# Patient Record
Sex: Female | Born: 1985 | Race: White | Hispanic: No | Marital: Single | Smoking: Never smoker
Health system: Southern US, Community
[De-identification: ages and names within clinical notes are randomized; demographics above are authoritative.]

## PROBLEM LIST (undated history)

## (undated) DIAGNOSIS — T7840XA Allergy, unspecified, initial encounter: Secondary | ICD-10-CM

## (undated) DIAGNOSIS — G809 Cerebral palsy, unspecified: Secondary | ICD-10-CM

## (undated) DIAGNOSIS — G43909 Migraine, unspecified, not intractable, without status migrainosus: Secondary | ICD-10-CM

## (undated) DIAGNOSIS — F419 Anxiety disorder, unspecified: Secondary | ICD-10-CM

## (undated) DIAGNOSIS — F32A Depression, unspecified: Secondary | ICD-10-CM

## (undated) HISTORY — DX: Allergy, unspecified, initial encounter: T78.40XA

## (undated) HISTORY — DX: Cerebral palsy, unspecified: G80.9

## (undated) HISTORY — DX: Migraine, unspecified, not intractable, without status migrainosus: G43.909

## (undated) HISTORY — PX: FOOT CAPSULE RELEASE W/ PERCUTANEOUS HEEL CORD LENGTHENING, TIBIAL TENDON TRANSFER: SHX1658

## (undated) HISTORY — DX: Anxiety disorder, unspecified: F41.9

---
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
## 2007-04-26 ENCOUNTER — Emergency Department (HOSPITAL_COMMUNITY): Admission: EM | Admit: 2007-04-26 | Discharge: 2007-04-26 | Payer: Self-pay | Admitting: Emergency Medicine

## 2007-05-08 ENCOUNTER — Ambulatory Visit: Payer: Self-pay | Admitting: Family Medicine

## 2007-05-11 DIAGNOSIS — G809 Cerebral palsy, unspecified: Secondary | ICD-10-CM | POA: Insufficient documentation

## 2007-05-11 DIAGNOSIS — J45909 Unspecified asthma, uncomplicated: Secondary | ICD-10-CM | POA: Insufficient documentation

## 2007-05-11 DIAGNOSIS — J309 Allergic rhinitis, unspecified: Secondary | ICD-10-CM | POA: Insufficient documentation

## 2007-08-05 ENCOUNTER — Telehealth (INDEPENDENT_AMBULATORY_CARE_PROVIDER_SITE_OTHER): Payer: Self-pay | Admitting: *Deleted

## 2007-12-29 IMAGING — CT CT PELVIS W/O CM
2 of 4 series · 17 of 46 positions shown, 19 images · IV contrast (agent unspecified)
Comparison: None.

CLINICAL DATA: Abdominal pain with nausea and fever.  History of CONTRAST ALLERGY.  
 ABDOMEN CT WITHOUT CONTRAST:
TECHNIQUE: Multidetector CT imaging of the abdomen was performed following the standard protocol without IV contrast.  Oral contrast was administered.
TECHNIQUE: Multidetector CT imaging of the pelvis was performed following the standard protocol without IV contrast.  Oral contrast was administered.

[Series 2: abd pelvis · axial · 0.70mm/px · z∈[-454,-4]mm · 14 of 98 slices shown, 16 images]
[im 4/98  soft-tissue]
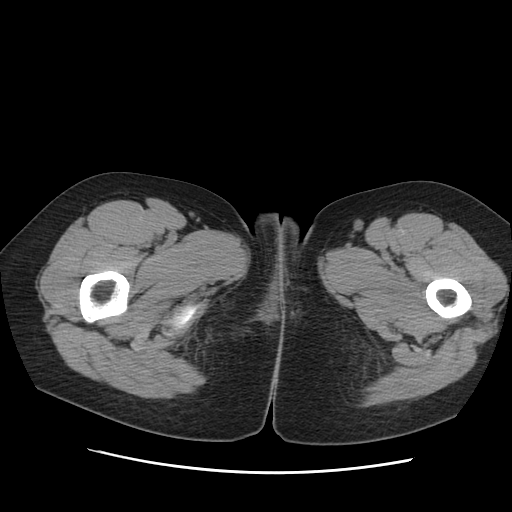
[im 4/98  bone]
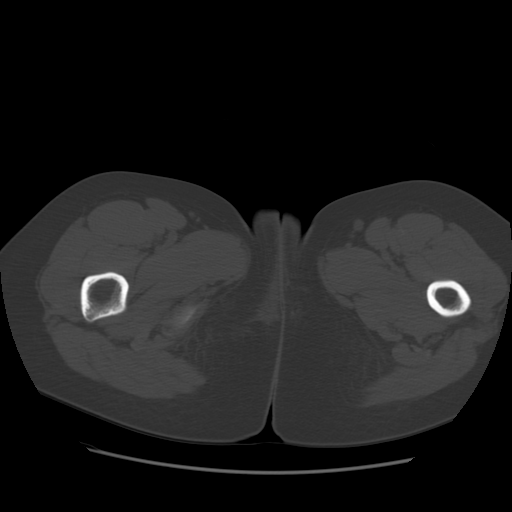
[im 12/98  soft-tissue]
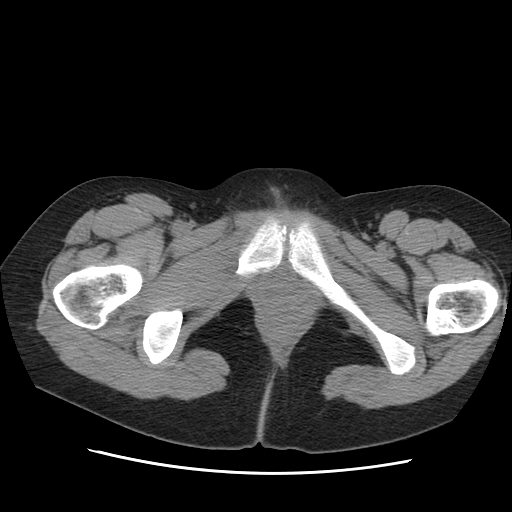
[im 19/98  soft-tissue]
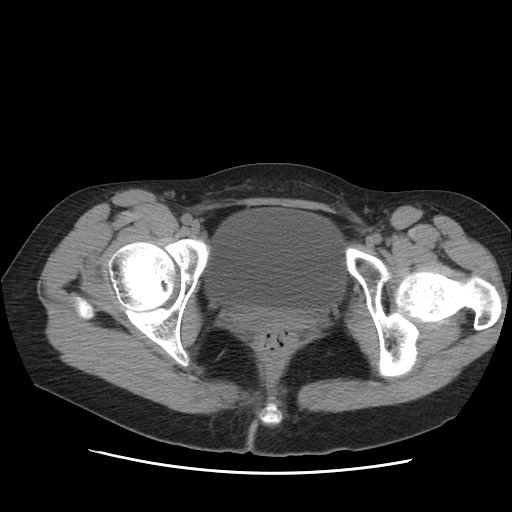
[im 27/98  soft-tissue]
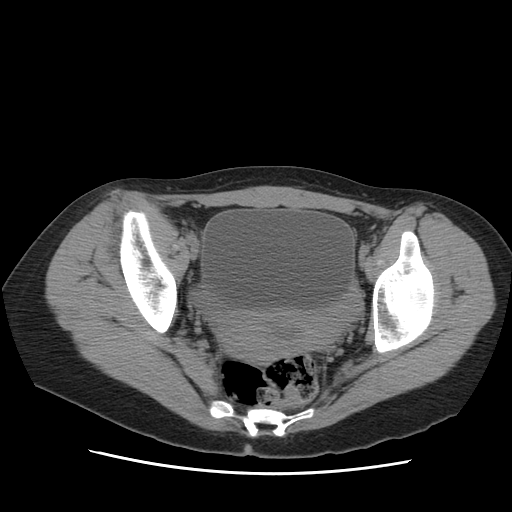
[im 34/98  soft-tissue]
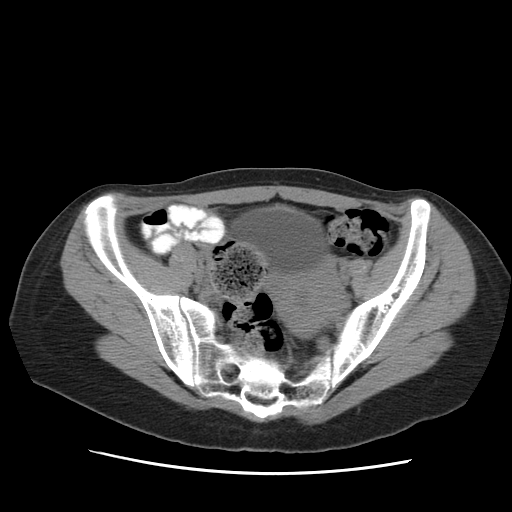
[im 38/98  soft-tissue]
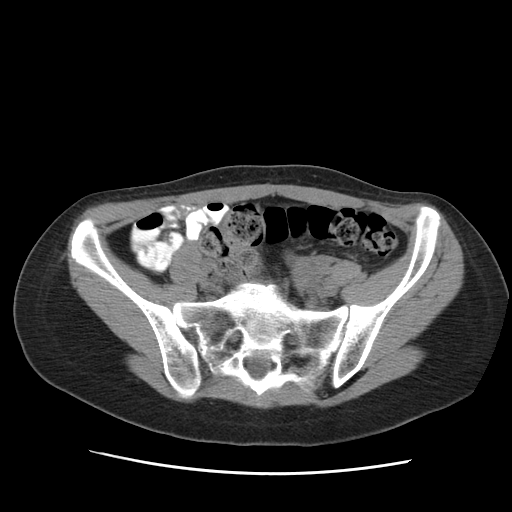
[im 45/98  soft-tissue]
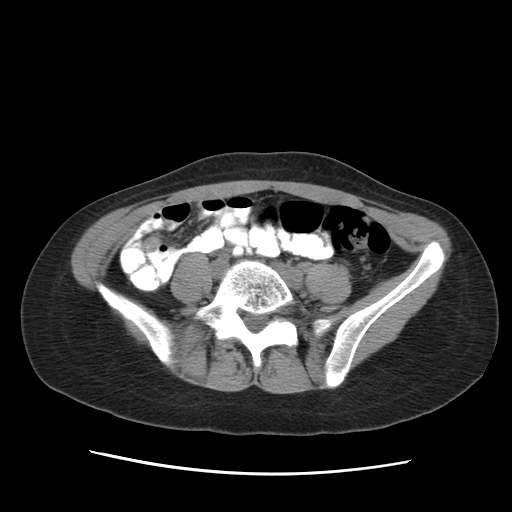
[im 53/98  soft-tissue]
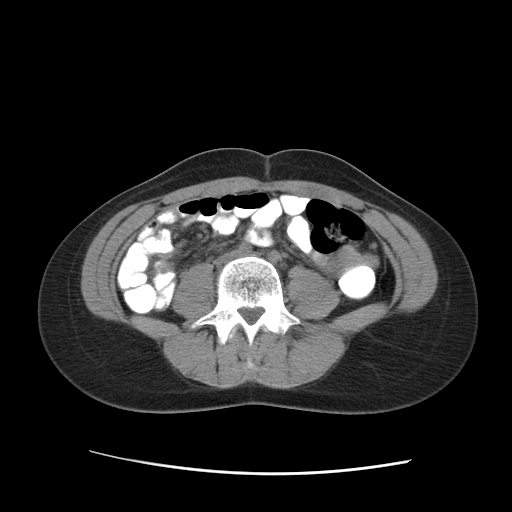
[im 60/98  soft-tissue]
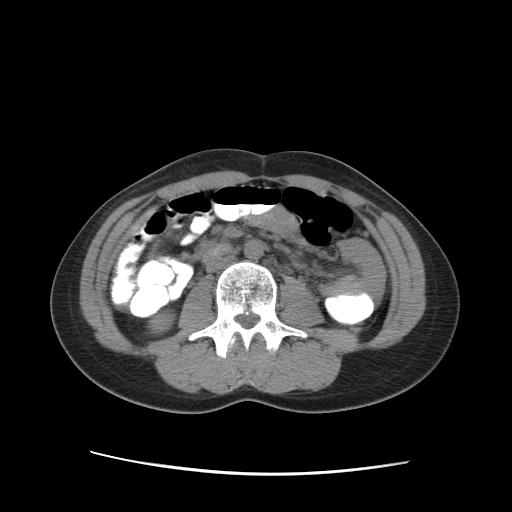
[im 60/98  bone]
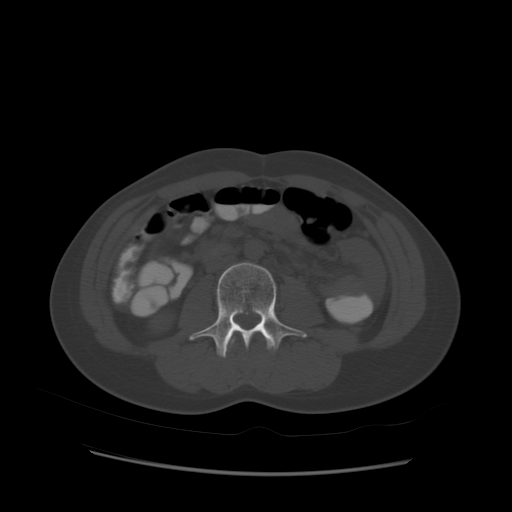
[im 64/98  soft-tissue]
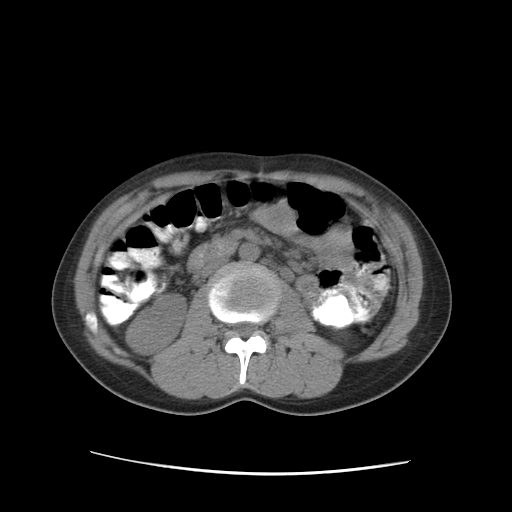
[im 71/98  soft-tissue]
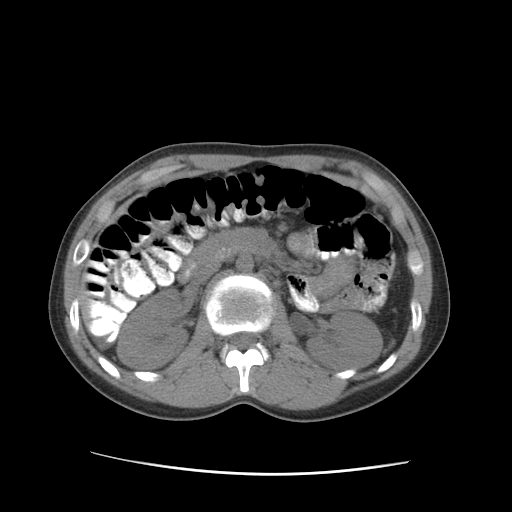
[im 79/98  soft-tissue]
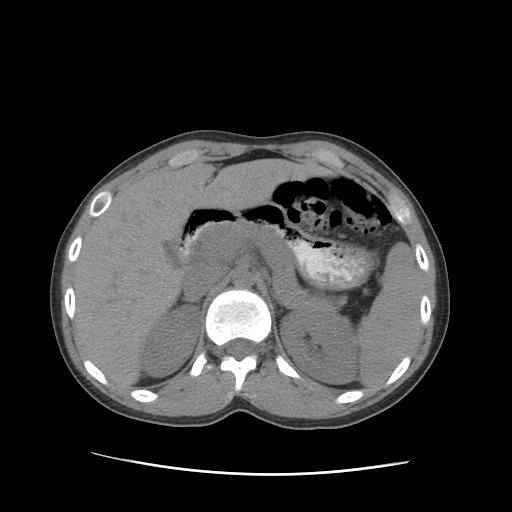
[im 86/98  soft-tissue]
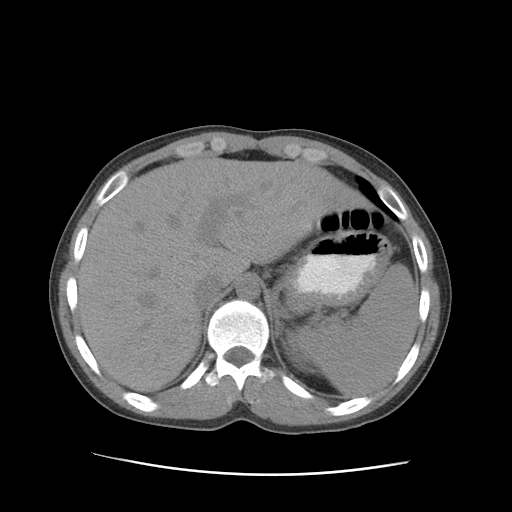
[im 94/98  soft-tissue]
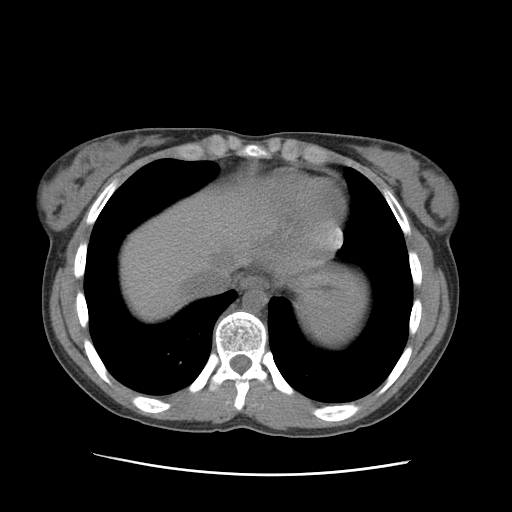

[Series 401: coronal · coronal · 0.90mm/px · 3 of 97 slices shown]
[im 33/97  soft-tissue]
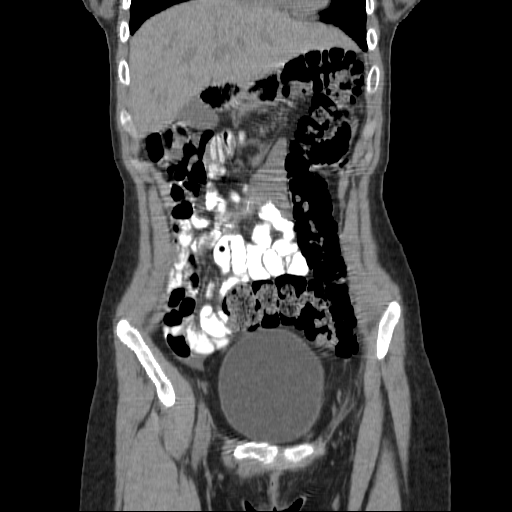
[im 43/97  soft-tissue]
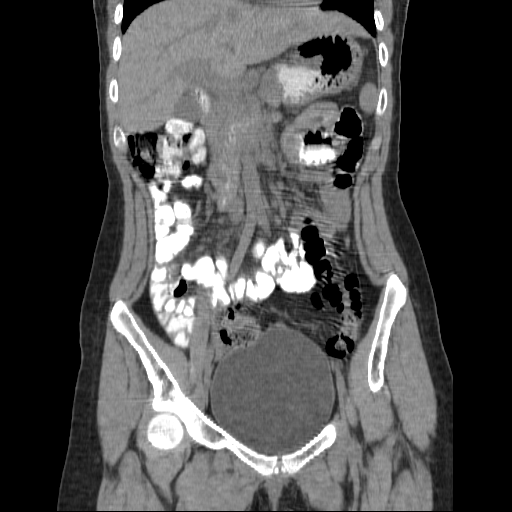
[im 54/97  soft-tissue]
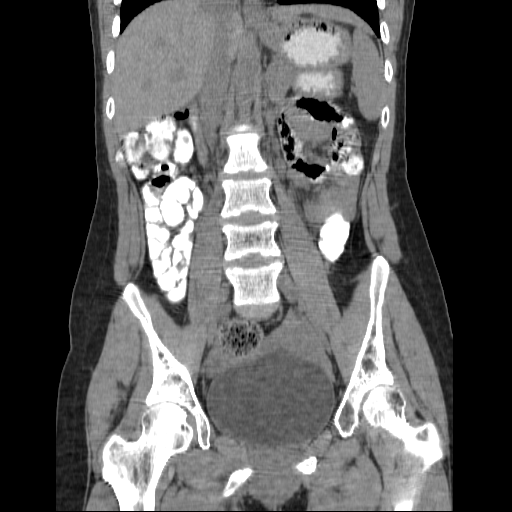

[17 of 46 positions shown; findings below may reference images not displayed]

FINDINGS: Lung bases are clear.  Liver, spleen, pancreas and adrenals appear normal in the unenhanced state.  There are no renal calculi.  No hydronephrosis or hydroureter.  There is a small simple cyst of the mid right kidney.  No adenopathy or ascites. 
 Note is made of a high positioned cecum, ileocecal valve and normal air filled appendix.
IMPRESSION: No acute or significant findings.  Note is made of a high positioned cecum with normal appendix.  
 PELVIS CT WITHOUT CONTRAST:
FINDINGS: No focal masses, adenopathy or fluid collections.  The uterus is eccentrically positioned to the left of midline.  There is a likely right ovarian cyst.  
 No free pelvic fluid.
IMPRESSION: No acute or significant findings.

## 2008-02-24 ENCOUNTER — Telehealth (INDEPENDENT_AMBULATORY_CARE_PROVIDER_SITE_OTHER): Payer: Self-pay | Admitting: *Deleted

## 2008-03-01 ENCOUNTER — Ambulatory Visit: Payer: Self-pay | Admitting: Family Medicine

## 2008-04-06 ENCOUNTER — Telehealth: Payer: Self-pay | Admitting: Family Medicine

## 2008-10-14 ENCOUNTER — Ambulatory Visit: Payer: Self-pay | Admitting: Family Medicine

## 2008-10-14 DIAGNOSIS — N3000 Acute cystitis without hematuria: Secondary | ICD-10-CM | POA: Insufficient documentation

## 2008-10-14 LAB — CONVERTED CEMR LAB
Ketones, urine, test strip: NEGATIVE
Nitrite: NEGATIVE
Specific Gravity, Urine: >=1.03
Urobilinogen, UA: 0.2
pH: 5

## 2008-11-17 ENCOUNTER — Ambulatory Visit: Payer: Self-pay | Admitting: Family Medicine

## 2008-11-17 DIAGNOSIS — J209 Acute bronchitis, unspecified: Secondary | ICD-10-CM

## 2008-11-25 ENCOUNTER — Encounter: Payer: Self-pay | Admitting: Family Medicine

## 2008-11-25 ENCOUNTER — Ambulatory Visit: Payer: Self-pay | Admitting: Family Medicine

## 2008-11-25 ENCOUNTER — Other Ambulatory Visit: Admission: RE | Admit: 2008-11-25 | Discharge: 2008-11-25 | Payer: Self-pay | Admitting: Family Medicine

## 2008-11-28 LAB — CONVERTED CEMR LAB
Basophils Relative: 0.5 % (ref 0.0–3.0)
Chloride: 110 meq/L (ref 96–112)
Cholesterol: 144 mg/dL (ref 0–200)
Eosinophils Absolute: 0.2 10*3/uL (ref 0.0–0.7)
Eosinophils Relative: 4 % (ref 0.0–5.0)
GFR calc Af Amer: 164 mL/min
LDL Cholesterol: 83 mg/dL (ref 0–99)
MCHC: 34.6 g/dL (ref 30.0–36.0)
Monocytes Relative: 7.9 % (ref 3.0–12.0)
Neutrophils Relative %: 59 % (ref 43.0–77.0)
Platelets: 217 10*3/uL (ref 150–400)
RBC: 3.88 M/uL (ref 3.87–5.11)
TSH: 1.63 microintl units/mL (ref 0.35–5.50)
Total Bilirubin: 0.6 mg/dL (ref 0.3–1.2)
Triglycerides: 28 mg/dL (ref 0–149)
WBC: 4.6 10*3/uL (ref 4.5–10.5)

## 2009-07-19 ENCOUNTER — Telehealth: Payer: Self-pay | Admitting: Family Medicine

## 2009-07-21 ENCOUNTER — Ambulatory Visit: Payer: Self-pay | Admitting: Family Medicine

## 2009-07-21 DIAGNOSIS — F4321 Adjustment disorder with depressed mood: Secondary | ICD-10-CM | POA: Insufficient documentation

## 2009-08-21 ENCOUNTER — Ambulatory Visit: Payer: Self-pay | Admitting: Family Medicine

## 2009-08-21 DIAGNOSIS — J019 Acute sinusitis, unspecified: Secondary | ICD-10-CM | POA: Insufficient documentation

## 2009-11-09 ENCOUNTER — Ambulatory Visit: Payer: Self-pay | Admitting: Family Medicine

## 2009-11-09 DIAGNOSIS — R42 Dizziness and giddiness: Secondary | ICD-10-CM | POA: Insufficient documentation

## 2009-11-09 LAB — CONVERTED CEMR LAB
Blood in Urine, dipstick: NEGATIVE
Glucose, Urine, Semiquant: NEGATIVE
Protein, U semiquant: NEGATIVE
Specific Gravity, Urine: >=1.03
Urobilinogen, UA: 0.2
pH: 7

## 2009-11-10 LAB — CONVERTED CEMR LAB
ALT: 21 units/L (ref 0–35)
AST: 24 units/L (ref 0–37)
Albumin: 4.1 g/dL (ref 3.5–5.2)
BUN: 11 mg/dL (ref 6–23)
Basophils Relative: 0.4 % (ref 0.0–3.0)
Calcium: 8.9 mg/dL (ref 8.4–10.5)
Chloride: 100 meq/L (ref 96–112)
Eosinophils Absolute: 0.1 10*3/uL (ref 0.0–0.7)
Glucose, Bld: 75 mg/dL (ref 70–99)
HCT: 37.1 % (ref 36.0–46.0)
Hemoglobin: 12.6 g/dL (ref 12.0–15.0)
Lymphocytes Relative: 21.6 % (ref 12.0–46.0)
MCHC: 33.9 g/dL (ref 30.0–36.0)
Monocytes Relative: 6.3 % (ref 3.0–12.0)
Potassium: 4.2 meq/L (ref 3.5–5.1)
Total Bilirubin: 0.8 mg/dL (ref 0.3–1.2)

## 2009-11-20 ENCOUNTER — Ambulatory Visit: Payer: Self-pay | Admitting: Licensed Clinical Social Worker

## 2009-11-28 ENCOUNTER — Telehealth: Payer: Self-pay | Admitting: Family Medicine

## 2009-11-29 ENCOUNTER — Ambulatory Visit: Payer: Self-pay | Admitting: Licensed Clinical Social Worker

## 2009-12-04 ENCOUNTER — Ambulatory Visit: Payer: Self-pay | Admitting: Licensed Clinical Social Worker

## 2009-12-18 ENCOUNTER — Ambulatory Visit: Payer: Self-pay | Admitting: Licensed Clinical Social Worker

## 2009-12-25 ENCOUNTER — Ambulatory Visit: Payer: Self-pay | Admitting: Licensed Clinical Social Worker

## 2010-04-03 ENCOUNTER — Ambulatory Visit: Payer: Self-pay | Admitting: Internal Medicine

## 2010-04-03 DIAGNOSIS — G44229 Chronic tension-type headache, not intractable: Secondary | ICD-10-CM

## 2010-05-29 ENCOUNTER — Ambulatory Visit: Payer: Self-pay | Admitting: Family Medicine

## 2010-05-29 DIAGNOSIS — M26629 Arthralgia of temporomandibular joint, unspecified side: Secondary | ICD-10-CM | POA: Insufficient documentation

## 2010-05-29 DIAGNOSIS — F329 Major depressive disorder, single episode, unspecified: Secondary | ICD-10-CM

## 2010-06-04 ENCOUNTER — Telehealth: Payer: Self-pay | Admitting: Family Medicine

## 2010-11-21 ENCOUNTER — Encounter: Payer: Self-pay | Admitting: Family Medicine

## 2010-11-27 NOTE — Progress Notes (Signed)
Summary: doesn't like Celexa  Phone Note Call from Patient   Caller: Patient Call For: Nelwyn Salisbury MD Summary of Call: Celexa is giving her a headache and nausea on the first 2 days. [phone] Initial call taken by: Lynann Beaver CMA,  June 04, 2010 4:47 PM  Follow-up for Phone Call        try cutting the pill in half  Follow-up by: Nelwyn Salisbury MD,  June 04, 2010 5:00 PM  Additional Follow-up for Phone Call Additional follow up Details #1::        [facility] Additional Follow-up by: Lynann Beaver CMA,  June 05, 2010 8:04 AM    Additional Follow-up for Phone Call Additional follow up Details #2::    Pt notified. Follow-up by: Lynann Beaver CMA,  June 05, 2010 11:03 AM

## 2010-11-27 NOTE — Progress Notes (Signed)
Summary: Pt needs to get immunization record  Phone Note Call from Patient Call back at Home Phone [phone]   Caller: Patient Summary of Call: Pt is req immunization record. 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 Pt needs to pick this up tomorrow a.m.  Initial call taken by: Lucy Antigua,  November 28, 2009 9:47 AM  Follow-up for Phone Call        form up front ready for p/u, pt aware Follow-up by: Alfred Levins, CMA,  November 29, 2009 8:41 AM

## 2010-11-27 NOTE — Assessment & Plan Note (Signed)
Summary: syncope/njr   Vital Signs:  Patient profile:   26 year old female Menstrual status:  regular Weight:      150 pounds Temp:     98.0 degrees F oral Pulse rate:   66 / minute BP sitting:   122 / 82  (left arm) Cuff size:   regular  Vitals Entered By: Alfred Levins, CMA (November 09, 2009 11:11 AM) CC: 2 syncopal episodes this morning, fatigued and h/a   History of Present Illness: Here for 2 spells of lightheadedness this am which occurred at work. She felt fine last night and got a good night's sleep. She felt fine when she got up and had her usual breakfast of cereal. At work she felt slightly shaky and had a mild HA, then she suddenly felt warm and weak, and thought she might pass out. She sat down and this feeling went away. Then a few minutes later it happened again. She sat down again, and again it went away. This has not happened any more after that. She never completely lost consciousness. Now she feels fine except for a mild HA. No SOB or chest pains or nausea or palpitations. No vision changes and no neurologic deficits.  Her only med is her BCP which she has been in regularly for the past 4 months. Her LMP was 6 weeks ago, but she is usually irregular. I asked about stress possible contributing to these episodes, and she admits to having a tough time over the holidays with issues including missing her mother. She is interested in seeing a therapist, which I have suggested she do numerous times.  Current Medications (verified): 1)  Gianvi 3-0.02 Mg Tabs (Drospirenone-Ethinyl Estradiol) .Marland Kitchen.. 1 By Mouth Once Daily  Allergies (verified): 1)  ! * Dye  Past History:  Past Medical History: Reviewed history from 03/01/2008 and no changes required. Allergic rhinitis Asthma Cerebral Palsy  Past Surgical History: Reviewed history from 11/25/2008 and no changes required. Lengthen a tight heel cord left leg  Review of Systems  The patient denies anorexia, fever, weight  loss, weight gain, vision loss, decreased hearing, hoarseness, chest pain, syncope, dyspnea on exertion, peripheral edema, prolonged cough, headaches, hemoptysis, abdominal pain, melena, hematochezia, severe indigestion/heartburn, hematuria, incontinence, genital sores, muscle weakness, suspicious skin lesions, transient blindness, difficulty walking, depression, unusual weight change, abnormal bleeding, enlarged lymph nodes, angioedema, breast masses, and testicular masses.    Physical Exam  General:  Well-developed,well-nourished,in no acute distress; alert,appropriate and cooperative throughout examination Head:  Normocephalic and atraumatic without obvious abnormalities. No apparent alopecia or balding. Eyes:  No corneal or conjunctival inflammation noted. EOMI. Perrla. Funduscopic exam benign, without hemorrhages, exudates or papilledema. Vision grossly normal. Ears:  External ear exam shows no significant lesions or deformities.  Otoscopic examination reveals clear canals, tympanic membranes are intact bilaterally without bulging, retraction, inflammation or discharge. Hearing is grossly normal bilaterally. Nose:  External nasal examination shows no deformity or inflammation. Nasal mucosa are pink and moist without lesions or exudates. Mouth:  Oral mucosa and oropharynx without lesions or exudates.  Teeth in good repair. Neck:  No deformities, masses, or tenderness noted. Lungs:  Normal respiratory effort, chest expands symmetrically. Lungs are clear to auscultation, no crackles or wheezes. Heart:  Normal rate and regular rhythm. S1 and S2 normal without gallop, murmur, click, rub or other extra sounds. Neurologic:  No cranial nerve deficits noted. Station and gait are normal. Plantar reflexes are down-going bilaterally. DTRs are symmetrical throughout. Sensory, motor and coordinative functions  appear intact. Psych:  Cognition and judgment appear intact. Alert and cooperative with normal  attention span and concentration. No apparent delusions, illusions, hallucinations   Impression & Recommendations:  Problem # 1:  DIZZINESS (ICD-780.4)  Orders: Venipuncture (16109) TLB-BMP (Basic Metabolic Panel-BMET) (80048-METABOL) TLB-CBC Platelet - w/Differential (85025-CBCD) TLB-Hepatic/Liver Function Pnl (80076-HEPATIC) TLB-TSH (Thyroid Stimulating Hormone) (84443-TSH) UA Dipstick w/o Micro (manual) (60454) Urine Pregnancy Test  (09811)  Complete Medication List: 1)  Gianvi 3-0.02 Mg Tabs (Drospirenone-ethinyl estradiol) .Marland Kitchen.. 1 by mouth once daily  Patient Instructions: 1)  Get labs today to rule out anemia, glucose abnormalities, etc. but I think stress may be at the heart of the problem. I strongly encouraged her to contact Judithe Modest here to set up some therapy, and she said she would.   Laboratory Results   Urine Tests  Date/Time Received: November 09, 2009 12:12 PM Date/Time Reported: November 09, 2009 12:12 PM  Routine Urinalysis   Color: yellow Appearance: Clear Glucose: negative   (Normal Range: Negative) Bilirubin: small   (Normal Range: Negative) Ketone: negative   (Normal Range: Negative) Spec. Gravity: >=1.030   (Normal Range: 1.003-1.035) Blood: negative   (Normal Range: Negative) pH: 7.0   (Normal Range: 5.0-8.0) Protein: negative   (Normal Range: Negative) Urobilinogen: 0.2   (Normal Range: 0-1) Nitrite: negative   (Normal Range: Negative) Leukocyte Esterace: small   (Normal Range: Negative)    Urine HCG: negative Comments: Alfred Levins, CMA  November 09, 2009 12:12 PM

## 2010-11-27 NOTE — Assessment & Plan Note (Signed)
Summary: ?ear inf/ok per deb/njr----PT Julia Anderson [facility] // RS   Vital Signs:  Patient profile:   25 year old female Menstrual status:  regular Weight:      148 pounds Temp:     98.3 degrees F oral BP sitting:   120 / 80  (left arm) Cuff size:   regular  Vitals Entered By: Kathrynn Speed CMA (May 29, 2010 2:42 PM) CC: Pain in right ear feels like fuild in ear, x 4 day, drainage down her throat, headache, neusea, not want to eat, pain in right ear when she chews food,src    History of Present Illness: Here for 2 problems. First she has had a tough month with dealing with the anniversaries of significant losses in her life. 2 years ago her mother died, and one year ago her step father died. She has been very depressed and anxious, tearful, can't sleep , etc. She denies any suicidal thoughts. I have recommended medications for her seberal time sin the past, and she has always declined. Today however she is willing to discuss this. Her insurance will not cover seeing a therapist, so she cannot afford this. Second, for one week she has had pain in the right ear and the right side of her face. No fever or HA or ST. She chews gum frequently. Chewing makes this pain worse.   Current Medications (verified): 1)  Gianvi 3-0.02 Mg Tabs (Drospirenone-Ethinyl Estradiol) .Marland Kitchen.. 1 By Mouth Once Daily  Allergies (verified): 1)  ! * Dye  Past History:  Past Medical History: Reviewed history from 03/01/2008 and no changes required. Allergic rhinitis Asthma Cerebral Palsy  Review of Systems  The patient denies anorexia, fever, weight loss, weight gain, vision loss, decreased hearing, hoarseness, chest pain, syncope, dyspnea on exertion, peripheral edema, prolonged cough, headaches, hemoptysis, abdominal pain, melena, hematochezia, severe indigestion/heartburn, hematuria, incontinence, genital sores, muscle weakness, suspicious skin lesions, transient blindness, difficulty walking, unusual weight change, abnormal  bleeding, enlarged lymph nodes, angioedema, breast masses, and testicular masses.    Physical Exam  General:  Well-developed,well-nourished,in no acute distress; alert,appropriate and cooperative throughout examination Head:  Normocephalic and atraumatic without obvious abnormalities. No apparent alopecia or balding. Eyes:  No corneal or conjunctival inflammation noted. EOMI. Perrla. Funduscopic exam benign, without hemorrhages, exudates or papilledema. Vision grossly normal. Ears:  External ear exam shows no significant lesions or deformities.  Otoscopic examination reveals clear canals, tympanic membranes are intact bilaterally without bulging, retraction, inflammation or discharge. Hearing is grossly normal bilaterally. Nose:  External nasal examination shows no deformity or inflammation. Nasal mucosa are pink and moist without lesions or exudates. Mouth:  Oral mucosa and oropharynx without lesions or exudates.  Teeth in good repair. Her right TMJ is very tender and has some crepitus.  Neck:  No deformities, masses, or tenderness noted. Psych:  Oriented X3, memory intact for recent and remote, normally interactive, and depressed affect.     Impression & Recommendations:  Problem # 1:  DEPRESSION (ICD-311)  Her updated medication list for this problem includes:    Celexa 20 Mg Tabs (Citalopram hydrobromide) ..... Once daily  Problem # 2:  TEMPOROMANDIBULAR JOINT PAIN (ICD-524.62)  Complete Medication List: 1)  Gianvi 3-0.02 Mg Tabs (Drospirenone-ethinyl estradiol) .Marland Kitchen.. 1 by mouth once daily 2)  Diclofenac Sodium 50 Mg Tbec (Diclofenac sodium) .... Three times a day as needed for pain 3)  Celexa 20 Mg Tabs (Citalopram hydrobromide) .... Once daily  Patient Instructions: 1)  Start on Celexa, and recheck in 2  weeks. Advised her to stop chewing gum. try Diclofenac. She needs to see her dentist as well.  Prescriptions: CELEXA 20 MG TABS (CITALOPRAM HYDROBROMIDE) once daily  #30 x 2    Entered and Authorized by:   Nelwyn Salisbury MD   Signed by:   Nelwyn Salisbury MD on 05/29/2010   Method used:   Electronically to        [facility] [address] [phone]* (retail)       [address] [place], [place]  [address]       Ph: [phone]       Fax: [phone]   RxID:   [id number] DICLOFENAC SODIUM 50 MG TBEC (DICLOFENAC SODIUM) three times a day as needed for pain  #60 x 5   Entered and Authorized by:   Nelwyn Salisbury MD   Signed by:   Nelwyn Salisbury MD on 05/29/2010   Method used:   Electronically to        [facility] [address] [phone]* (retail)       [address] [place], [place]  [address]       Ph: [phone]       Fax: [phone]   RxID:   [id number]

## 2010-11-27 NOTE — Assessment & Plan Note (Signed)
Summary: headaches//ccm   Vital Signs:  Patient profile:   25 year old female Menstrual status:  regular Weight:      143 pounds Temp:     98.4 degrees F oral BP sitting:   108 / 70  (right arm) Cuff size:   regular  Vitals Entered By: Duard Brady LPN (April 04, 5783 11:02 AM) CC: c/o headache/(L) side head pressure - re-occuring problem   , fatigue Is Patient Diabetic? No   CC:  c/o headache/(L) side head pressure - re-occuring problem    and fatigue.  History of Present Illness: 25 year old female, who has a long history of headaches.  She states that at  age 77, she had a negative head CT;  describes fairly mild headaches often begin in the left temporal region.  They seemed to benefit from Advil and Excedrin.  No focal neurological symptoms.  She does have a history of allergic rhinitis, but very little in the way of allergy symptoms.  Although the headaches are fairly mild.  She is somewhat concerned due to the chronicity  Preventive Screening-Counseling & Management  Alcohol-Tobacco     Smoking Status: never  Allergies: 1)  ! * Dye  Past History:  Past Medical History: Reviewed history from 03/01/2008 and no changes required. Allergic rhinitis Asthma Cerebral Palsy  Physical Exam  General:  Well-developed,well-nourished,in no acute distress; alert,appropriate and cooperative throughout examination Head:  Normocephalic and atraumatic without obvious abnormalities. No apparent alopecia or balding. Eyes:  No corneal or conjunctival inflammation noted. EOMI. Perrla. Funduscopic exam benign, without hemorrhages, exudates or papilledema. Vision grossly normal. Mouth:  low-lying uvula.  low-lying uvula.   Neck:  No deformities, masses, or tenderness noted. Lungs:  Normal respiratory effort, chest expands symmetrically. Lungs are clear to auscultation, no crackles or wheezes. Heart:  Normal rate and regular rhythm. S1 and S2 normal without gallop, murmur, click, rub  or other extra sounds. Neurologic:  alert & oriented X3, cranial nerves II-XII intact, sensation intact to light touch, gait normal, and finger-to-nose normal.  alert & oriented X3, cranial nerves II-XII intact, sensation intact to light touch, gait normal, and finger-to-nose normal.     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 1 by mouth once daily 2)  Excedrin Extra Strength [phone] Mg Tabs (Aspirin-acetaminophen-caffeine) .... Prn 3)  Ibuprofen 200 Mg Tabs (Ibuprofen) .... Prn  Patient Instructions: 1)  Please schedule a follow-up appointment as needed. Prescriptions: GIANVI 3-0.02 MG TABS (DROSPIRENONE-ETHINYL ESTRADIOL) 1 by mouth once daily  #3 month x 4   Entered and Authorized by:   Gordy Savers  MD   Signed by:   Gordy Savers  MD on 04/03/2010   Method used:   Electronically to        [facility] [address] [phone]* (retail)       [address] [place], [place]  [address]       Ph: [phone]       Fax: [phone]   RxID:   [id number]

## 2010-12-03 ENCOUNTER — Ambulatory Visit (INDEPENDENT_AMBULATORY_CARE_PROVIDER_SITE_OTHER): Payer: BC Managed Care – PPO | Admitting: Family Medicine

## 2010-12-03 ENCOUNTER — Other Ambulatory Visit (HOSPITAL_COMMUNITY)
Admission: RE | Admit: 2010-12-03 | Discharge: 2010-12-03 | Disposition: A | Discharge status: Home / Self Care | Payer: BC Managed Care – PPO | Source: Ambulatory Visit | Attending: Family Medicine | Admitting: Family Medicine

## 2010-12-03 ENCOUNTER — Other Ambulatory Visit: Payer: Self-pay | Admitting: Family Medicine

## 2010-12-03 ENCOUNTER — Encounter: Payer: Self-pay | Admitting: Family Medicine

## 2010-12-03 VITALS — BP 110/70 | HR 70 | Ht 68.0 in | Wt 158.0 lb

## 2010-12-03 DIAGNOSIS — Z0000 Encounter for general adult medical examination without abnormal findings: Secondary | ICD-10-CM

## 2010-12-03 DIAGNOSIS — Z01419 Encounter for gynecological examination (general) (routine) without abnormal findings: Secondary | ICD-10-CM | POA: Insufficient documentation

## 2010-12-03 LAB — BASIC METABOLIC PANEL
BUN: 9 mg/dL (ref 6–23)
Chloride: 104 mEq/L (ref 96–112)
Glucose, Bld: 84 mg/dL (ref 70–99)
Potassium: 4 mEq/L (ref 3.5–5.1)
Sodium: 138 mEq/L (ref 135–145)

## 2010-12-03 LAB — POCT URINALYSIS DIPSTICK
Nitrite, UA: NEGATIVE
Protein, UA: NEGATIVE

## 2010-12-03 LAB — HEPATIC FUNCTION PANEL
ALT: 18 U/L (ref 0–35)
AST: 22 U/L (ref 0–37)
Albumin: 3.8 g/dL (ref 3.5–5.2)
Alkaline Phosphatase: 41 U/L (ref 39–117)
Bilirubin, Direct: 0.1 mg/dL (ref 0.0–0.3)
Total Bilirubin: 0.6 mg/dL (ref 0.3–1.2)
Total Protein: 7.2 g/dL (ref 6.0–8.3)

## 2010-12-03 LAB — CBC WITH DIFFERENTIAL/PLATELET
Eosinophils Relative: 2.8 % (ref 0.0–5.0)
HCT: 38.1 % (ref 36.0–46.0)
Lymphocytes Relative: 30.5 % (ref 12.0–46.0)
Lymphs Abs: 1.2 10*3/uL (ref 0.7–4.0)
MCHC: 35.2 g/dL (ref 30.0–36.0)
MCV: 95.4 fl (ref 78.0–100.0)
Neutrophils Relative %: 59.9 % (ref 43.0–77.0)
Platelets: 191 10*3/uL (ref 150.0–400.0)
RDW: 12.9 % (ref 11.5–14.6)

## 2010-12-03 LAB — LIPID PANEL
HDL: 73.1 mg/dL (ref >39.00)
Total CHOL/HDL Ratio: 3
VLDL: 14.6 mg/dL (ref 0.0–40.0)

## 2010-12-03 MED ORDER — DROSPIRENONE-ETHINYL ESTRADIOL 3-0.02 MG PO TABS: 1.0000 | ORAL_TABLET | Freq: Every day | ORAL | Status: DC

## 2010-12-03 NOTE — Progress Notes (Signed)
  Subjective:    Patient ID: Julia Anderson, female    DOB: 10-05-1986, 25 y.o.   MRN: [id number]  HPI 25 yr old female for a cpx. She feels well and has no complaints. She is working and will soon be taking some online classes.   Review of Systems  Constitutional: Negative.  Negative for activity change, appetite change, fatigue and unexpected weight change.  HENT: Negative.  Negative for ear pain, facial swelling, neck pain, neck stiffness, sinus pressure and tinnitus.   Eyes: Negative.  Negative for pain, redness, itching and visual disturbance.  Respiratory: Negative.  Negative for apnea, cough, chest tightness, shortness of breath and wheezing.   Cardiovascular: Negative.  Negative for chest pain and leg swelling.  Gastrointestinal: Negative.  Negative for abdominal pain, diarrhea, constipation, blood in stool, abdominal distention and anal bleeding.  Genitourinary: Negative.  Negative for dysuria, frequency, vaginal discharge, vaginal pain, menstrual problem and pelvic pain.  Musculoskeletal: Negative.  Negative for back pain, joint swelling and arthralgias.  Skin: Negative for color change, pallor, rash and wound.  Neurological: Negative.  Negative for dizziness, seizures, speech difficulty, numbness and headaches.  Hematological: Negative.  Negative for adenopathy. Does not bruise/bleed easily.  Psychiatric/Behavioral: Negative.  Negative for behavioral problems, sleep disturbance, dysphoric mood and agitation. The patient is not nervous/anxious.        Objective:   Physical Exam  Constitutional: She is oriented to person, place, and time. She appears well-developed and well-nourished.  HENT:  Head: Normocephalic and atraumatic.  Right Ear: External ear normal.  Left Ear: External ear normal.  Nose: Nose normal.  Mouth/Throat: Oropharynx is clear and moist.  Eyes: Conjunctivae and EOM are normal. Pupils are equal, round, and reactive to light. No scleral icterus.  Neck:  Normal range of motion. Neck supple. No JVD present. No thyromegaly present.  Cardiovascular: Normal rate, regular rhythm, normal heart sounds and intact distal pulses.  Exam reveals no gallop and no friction rub.   No murmur heard. Pulmonary/Chest: Effort normal and breath sounds normal. No respiratory distress. She has no wheezes. She has no rales. She exhibits no tenderness.  Abdominal: Soft. Bowel sounds are normal. She exhibits no distension and no mass. There is no tenderness. There is no rebound and no guarding.  Genitourinary: Vagina normal and uterus normal. No vaginal discharge found.  Musculoskeletal: Normal range of motion. She exhibits no edema and no tenderness.  Lymphadenopathy:    She has no cervical adenopathy.  Neurological: She is alert and oriented to person, place, and time.  Skin: Skin is warm and dry. No rash noted.  Psychiatric: She has a normal mood and affect. Her behavior is normal. Judgment and thought content normal.  Her neurologic status is unchanged from there baseline.        Assessment & Plan:  Doing well. Get fasting labs today

## 2010-12-04 ENCOUNTER — Telehealth: Payer: Self-pay

## 2010-12-04 LAB — VITAMIN D 25 HYDROXY (VIT D DEFICIENCY, FRACTURES): Vit D, 25-Hydroxy: 26 ng/mL — ABNORMAL LOW (ref 30–89)

## 2010-12-04 NOTE — Telephone Encounter (Signed)
Pt notified>labs

## 2010-12-04 NOTE — Telephone Encounter (Signed)
Message copied by Madison Hickman on Tue Dec 04, 2010 11:26 AM ------      Message from: Dwaine Deter      Created: Tue Dec 04, 2010 10:28 AM       This is low. Take OTC vitamin D 2000 units a day

## 2010-12-04 NOTE — Telephone Encounter (Signed)
Pt aware of lab results 

## 2010-12-07 NOTE — Progress Notes (Signed)
Letter mailed

## 2010-12-12 ENCOUNTER — Telehealth: Payer: Self-pay

## 2010-12-12 NOTE — Progress Notes (Signed)
Can you call the lab to see what happened to this result?

## 2010-12-12 NOTE — Telephone Encounter (Signed)
Message copied by Kyung Rudd on Wed Dec 12, 2010  5:32 PM ------      Message from: Dwaine Deter      Created: Wed Dec 12, 2010  5:05 PM       Mildly low. Take 2000 units of vitamin D every day

## 2010-12-13 ENCOUNTER — Telehealth: Payer: Self-pay

## 2010-12-13 NOTE — Telephone Encounter (Signed)
Left mess to take vit d  2000units daily per dr fry. To call if questions

## 2010-12-13 NOTE — Telephone Encounter (Signed)
Message copied by Madison Hickman on Thu Dec 13, 2010  8:27 AM ------      Message from: Dwaine Deter      Created: Wed Dec 12, 2010  8:48 AM                   ----- Message -----         From: SYSTEM         Sent: 12/08/2010  12:00 AM           To: Nelwyn Salisbury

## 2010-12-13 NOTE — Telephone Encounter (Signed)
Message copied by Madison Hickman on Thu Dec 13, 2010  8:28 AM ------      Message from: Dwaine Deter      Created: Wed Dec 12, 2010  8:48 AM                   ----- Message -----         From: SYSTEM         Sent: 12/08/2010  12:00 AM           To: Nelwyn Salisbury

## 2011-03-15 NOTE — Assessment & Plan Note (Signed)
[facility]                                 ON-CALL NOTE   Julia, Anderson                      MRN:          [id number]  DATE:10/24/2006                            DOB:          1986-03-23    Tonight while on call I received a call from Ms. Cathie Hoops, who is  Julia Anderson's mother.  She says that her daughter has a history of  migraine headaches and usually take Imitrex for these.  She is followed  by Dr. Clent Ridges.  She had a recurrent migraine tonight and did not have any  more Imitrex, and was requesting a refill.  I subsequently called the  Imitrex in at the [facility] pharmacy for her, told her if the headaches are  persisting that she should follow up with Dr. Clent Ridges in the morning.     Bevelyn Buckles. Bensimhon, MD  Electronically Signed    DRB/MedQ  DD: 10/24/2006  DT: 10/24/2006  Job #: [id number]

## 2011-05-16 ENCOUNTER — Ambulatory Visit (INDEPENDENT_AMBULATORY_CARE_PROVIDER_SITE_OTHER): Payer: BC Managed Care – PPO | Admitting: Internal Medicine

## 2011-05-16 ENCOUNTER — Encounter: Payer: Self-pay | Admitting: Internal Medicine

## 2011-05-16 DIAGNOSIS — M26629 Arthralgia of temporomandibular joint, unspecified side: Secondary | ICD-10-CM

## 2011-05-16 DIAGNOSIS — M533 Sacrococcygeal disorders, not elsewhere classified: Secondary | ICD-10-CM

## 2011-05-16 NOTE — Progress Notes (Signed)
  Subjective:    Patient ID: Julia Anderson, female    DOB: 1985/12/16, 25 y.o.   MRN: [id number]  HPI  25 year old patient who fell 7 days ago sustaining trauma to her tailbone she has had persistent discomfort. She has a history of also TMJ pain which has been aggravated over the past several days as well. She has discontinued begun chewing and has slightly improved. She has not seen a dentist in some time and this was recommended    Review of Systems  Musculoskeletal: Positive for back pain.       Objective:   Physical Exam  Constitutional: She appears well-nourished. No distress.  HENT:       Right TMJ discomfort  Musculoskeletal:       Tenderness over the coccyx did reproduce her pain          Assessment & Plan:   TMJ dysfunction Coccydynia   we'll treat with short-term Celebrex. I've recommended the patient be evaluated by her dentist and may consider oral prosthesis

## 2011-05-16 NOTE — Patient Instructions (Signed)
Dental evaluation  Celebrex 200 mg daily as needed  Call or return to clinic prn if these symptoms worsen or fail to improve as anticipated.

## 2011-08-01 ENCOUNTER — Encounter: Payer: Self-pay | Admitting: Family Medicine

## 2011-08-01 ENCOUNTER — Ambulatory Visit (INDEPENDENT_AMBULATORY_CARE_PROVIDER_SITE_OTHER): Payer: BC Managed Care – PPO | Admitting: Family Medicine

## 2011-08-01 VITALS — BP 110/78 | HR 93 | Temp 99.0°F | Wt 156.0 lb

## 2011-08-01 DIAGNOSIS — Z0000 Encounter for general adult medical examination without abnormal findings: Secondary | ICD-10-CM

## 2011-08-01 DIAGNOSIS — N912 Amenorrhea, unspecified: Secondary | ICD-10-CM

## 2011-08-01 DIAGNOSIS — Z202 Contact with and (suspected) exposure to infections with a predominantly sexual mode of transmission: Secondary | ICD-10-CM

## 2011-08-01 MED ORDER — DROSPIRENONE-ETHINYL ESTRADIOL 3-0.02 MG PO TABS: 1.0000 | ORAL_TABLET | Freq: Every day | ORAL | Status: DC

## 2011-08-01 NOTE — Progress Notes (Signed)
  Subjective:    Patient ID: Julia Anderson, female    DOB: 1986/09/20, 25 y.o.   MRN: [id number]  HPI Here asking for a pregnancy test because she is late on her period. Her LMP was from late August to early September, so she is several weeks late. She is usually quite regular and never skips a cycle. She has had no cramps at all and no bleeding. She has been on Yaz for 2 years. She feels that she has gained a little weight and her breasts are swollen. She took 2 at home pregnancy tests in the past 2 days, one was positive and one was negative. She is also concerned about exposure to STDs. She has learned that her boyfriend of 2 years has been having sex with other people.    Review of Systems  Constitutional: Positive for unexpected weight change.  Genitourinary: Negative.        Objective:   Physical Exam  Constitutional: She appears well-developed and well-nourished.          Assessment & Plan:  Check a serum HCG today as well as some STD tests.

## 2011-08-02 LAB — HIV ANTIBODY (ROUTINE TESTING W REFLEX): HIV: NONREACTIVE

## 2011-08-05 ENCOUNTER — Telehealth: Payer: Self-pay | Admitting: Family Medicine

## 2011-08-05 NOTE — Telephone Encounter (Signed)
Requesting lab results. Thanks. °

## 2011-08-07 NOTE — Telephone Encounter (Signed)
Pt is still waiting on lab results

## 2011-08-07 NOTE — Telephone Encounter (Signed)
Pt called and said that she is going to work now. Pls call her at her work # [phone]

## 2011-08-08 NOTE — Telephone Encounter (Signed)
LMTCB, see results

## 2011-08-08 NOTE — Telephone Encounter (Signed)
Please call pt on her cell [phone]

## 2011-08-09 ENCOUNTER — Telehealth: Payer: Self-pay | Admitting: Family Medicine

## 2011-08-09 DIAGNOSIS — R102 Pelvic and perineal pain: Secondary | ICD-10-CM

## 2011-08-09 NOTE — Telephone Encounter (Signed)
I spoke with pt and gave results. Advised pt to take another home preg test and to call back today with results, per Dr. Clent Ridges.

## 2011-08-09 NOTE — Telephone Encounter (Signed)
Spoke with pt

## 2011-08-09 NOTE — Telephone Encounter (Signed)
Left voice message with results. Also checking to see if pt has had a menses yet?

## 2011-08-09 NOTE — Telephone Encounter (Signed)
Message copied by Baldemar Friday on Fri Aug 09, 2011 12:21 PM ------      Message from: Gershon Crane A      Created: Sun Aug 04, 2011  5:48 PM       Negative except the quantitative HCG is not back yet (this should have been back long ago). Please call about this

## 2011-08-09 NOTE — Telephone Encounter (Signed)
Message copied by Baldemar Friday on Fri Aug 09, 2011  9:43 AM ------      Message from: Gershon Crane A      Created: Sun Aug 04, 2011  5:48 PM       Negative except the quantitative HCG is not back yet (this should have been back long ago). Please call about this

## 2011-08-09 NOTE — Telephone Encounter (Signed)
Message copied by Baldemar Friday on Fri Aug 09, 2011  4:54 PM ------      Message from: Gershon Crane A      Created: Sun Aug 04, 2011  5:48 PM       Negative except the quantitative HCG is not back yet (this should have been back long ago). Please call about this

## 2011-08-09 NOTE — Telephone Encounter (Signed)
Spoke with pt and gave results. 

## 2011-08-09 NOTE — Telephone Encounter (Signed)
Per the patient, her at home pregnancy test today was negative. We will refer her to GYN to evaluate further.

## 2011-08-14 LAB — URINE CULTURE: Colony Count: 30000

## 2011-08-14 LAB — URINALYSIS, ROUTINE W REFLEX MICROSCOPIC
Bilirubin Urine: NEGATIVE
Ketones, ur: NEGATIVE
Nitrite: NEGATIVE
Protein, ur: 100 — AB
Specific Gravity, Urine: 1.024

## 2011-08-14 LAB — URINE MICROSCOPIC-ADD ON

## 2011-09-04 ENCOUNTER — Encounter: Payer: Self-pay | Admitting: Obstetrics & Gynecology

## 2011-09-04 ENCOUNTER — Ambulatory Visit (INDEPENDENT_AMBULATORY_CARE_PROVIDER_SITE_OTHER): Payer: BC Managed Care – PPO | Admitting: Obstetrics & Gynecology

## 2011-09-04 VITALS — BP 114/64 | HR 72 | Temp 97.0°F | Resp 16 | Ht 69.0 in | Wt 156.0 lb

## 2011-09-04 DIAGNOSIS — N912 Amenorrhea, unspecified: Secondary | ICD-10-CM

## 2011-09-04 LAB — POCT URINE PREGNANCY: Preg Test, Ur: NEGATIVE

## 2011-09-04 NOTE — Progress Notes (Signed)
  Subjective:    Patient ID: Julia Anderson, female    DOB: 09/14/1986, 25 y.o.   MRN: [id number]  HPI 25 to G1P0010 with LMP in Augut.  Pt had a positive pregnancy test in August followed by several negative home pregnancy tests.  Pt went to her primary care MD who did a "blood test" which was negative.  Pt is on Yaz which is a 24 day pill pack with only 4 days of placebo.  Pt has occasional sharp pain in her vagina.   Review of Systems  Constitutional: Negative.   Respiratory: Negative.   Cardiovascular: Negative.   Genitourinary: Positive for vaginal pain.  Musculoskeletal: Negative.        Objective:   Physical Exam  Constitutional: She is oriented to person, place, and time. She appears well-developed and well-nourished. No distress.  HENT:  Head: Normocephalic and atraumatic.  Eyes: Conjunctivae are normal.  Neck: Neck supple.  Cardiovascular: Normal rate and regular rhythm.   Pulmonary/Chest: Breath sounds normal.  Abdominal: Soft. She exhibits no distension and no mass. There is no tenderness. There is no rebound and no guarding.  Genitourinary: Vagina normal and uterus normal.       No adnexal pain or masses  Musculoskeletal: Normal range of motion.  Neurological: She is alert and oriented to person, place, and time.  Skin: Skin is warm and dry.  Psychiatric: She has a normal mood and affect.          Assessment & Plan:  25 yo female with amenorrhea.  Will get serum beta hcg to make sure she is not pregnant.   Most lkely she has amenorrhea from teh 24 day pill pack of Yaz.  Pt feels reassured.  Will have pt come back in 3 months to assess cycles.  Pt up to date on pap smear (had one at primary care office)

## 2011-10-25 ENCOUNTER — Telehealth: Payer: Self-pay

## 2011-10-25 NOTE — Telephone Encounter (Signed)
Pt states she has chest and head congestion, and trouble breathing due to asthma.  Pt denies fever. Pt had to call out of work today.  Pt would like ov; Pt advised to go to urgent care or [facility].

## 2011-11-07 ENCOUNTER — Ambulatory Visit (INDEPENDENT_AMBULATORY_CARE_PROVIDER_SITE_OTHER): Payer: BC Managed Care – PPO | Admitting: Family

## 2011-11-07 ENCOUNTER — Encounter: Payer: Self-pay | Admitting: Family

## 2011-11-07 VITALS — BP 120/80 | Temp 98.3°F | Wt 158.5 lb

## 2011-11-07 DIAGNOSIS — S46919A Strain of unspecified muscle, fascia and tendon at shoulder and upper arm level, unspecified arm, initial encounter: Secondary | ICD-10-CM

## 2011-11-07 DIAGNOSIS — IMO0002 Reserved for concepts with insufficient information to code with codable children: Secondary | ICD-10-CM

## 2011-11-07 MED ORDER — CYCLOBENZAPRINE HCL 10 MG PO TABS: 10.0000 mg | ORAL_TABLET | Freq: Three times a day (TID) | ORAL | Status: AC | PRN

## 2011-11-07 MED ORDER — DICLOFENAC SODIUM 50 MG PO TBEC: 50.0000 mg | DELAYED_RELEASE_TABLET | Freq: Three times a day (TID) | ORAL | Status: DC

## 2011-11-07 NOTE — Patient Instructions (Signed)
Muscle Strain A muscle strain, or pulled muscle, occurs when a muscle is over-stretched. A small number of muscle fibers may also be torn. This is especially common in athletes. This happens when a sudden violent force placed on a muscle pushes it past its capacity. Usually, recovery from a pulled muscle takes 1 to 2 weeks. But complete healing will take 5 to 6 weeks. There are millions of muscle fibers. Following injury, your body will usually return to normal quickly. HOME CARE INSTRUCTIONS   While awake, apply ice to the sore muscle for 15 to 20 minutes each hour for the first 2 days. Put ice in a plastic bag and place a towel between the bag of ice and your skin.   Do not use the pulled muscle for several days. Do not use the muscle if you have pain.   You may wrap the injured area with an elastic bandage for comfort. Be careful not to bind it too tightly. This may interfere with blood circulation.   Only take over-the-counter or prescription medicines for pain, discomfort, or fever as directed by your caregiver. Do not use aspirin as this will increase bleeding (bruising) at injury site.   Warming up before exercise helps prevent muscle strains.  SEEK MEDICAL CARE IF:  There is increased pain or swelling in the affected area. MAKE SURE YOU:   Understand these instructions.   Will watch your condition.   Will get help right away if you are not doing well or get worse.  Document Released: 10/14/2005 Document Revised: 06/26/2011 Document Reviewed: 05/13/2007 [employer] Patient Information 2012 [employer]. 

## 2011-11-07 NOTE — Progress Notes (Signed)
Subjective:    Patient ID: Julia Anderson, female    DOB: 1986-04-11, 26 y.o.   MRN: [id number]  HPI 26 year old white female, patient of Dr. Clent Ridges is in with complaint of left arm pain that's been gone off about 4-5 days. She describes the pain as a dull, achy, rating it a 5/10 at its worse with movement. She works at [employer] and does a lot of lifting. She's taken ibuprofen without much relief. Denies any injury. Patient denies any lightheadedness, dizziness, chest pain, palpitations, shortness of breath, or edema.   Review of Systems  Constitutional: Negative.   Respiratory: Negative.   Cardiovascular: Negative.   Musculoskeletal:       Left on pain  Skin: Negative.   Neurological: Negative.   Hematological: Negative.   Psychiatric/Behavioral: Negative.    Past Medical History  Diagnosis Date  . Allergy   . Asthma   . Cerebral palsy     History   Social History  . Marital Status: Single    Spouse Name: N/A    Number of Children: N/A  . Years of Education: N/A   Occupational History  . cosmetic advisor    Social History Main Topics  . Smoking status: Never Smoker   . Smokeless tobacco: Never Used  . Alcohol Use: Yes     occassionally  . Drug Use: No  . Sexually Active: Yes -- Female partner(s)   Other Topics Concern  . Not on file   Social History Narrative  . No narrative on file    Past Surgical History  Procedure Date  . Foot capsule release w/ percutaneous heel cord lengthening, tibial tendon transfer     lengthen a tight heel cord left leg    Family History  Problem Relation Age of Onset  . Suicidality Mother   . Cirrhosis Father   . Cancer Paternal Aunt     breast  . Diabetes Paternal Aunt   . Diabetes Paternal Grandfather   . Cancer Paternal Grandfather     prostate    Allergies  Allergen Reactions  . Iohexol      Desc: Pt had reaction to IV cm of itching and breathing heavy at [facility] back in 2006. Given benadryl at the time by Mom.       Current Outpatient Prescriptions on File Prior to Visit  Medication Sig Dispense Refill  . drospirenone-ethinyl estradiol (YAZ,GIANVI,LORYNA) 3-0.02 MG tablet Take 1 tablet by mouth daily.  30 tablet  11  . HYDROcodone-acetaminophen (VICODIN) 5-500 MG per tablet Take 1 tablet by mouth every 6 (six) hours as needed.          BP 120/80  Temp(Src) 98.3 F (36.8 C) (Oral)  Wt 158 lb 8 oz (71.895 kg)chart    Objective:   Physical Exam  Constitutional: She is oriented to person, place, and time. She appears well-developed.  Neck: Normal range of motion. Neck supple.  Cardiovascular: Normal rate, regular rhythm and normal heart sounds.   Pulmonary/Chest: Effort normal and breath sounds normal.  Musculoskeletal: Normal range of motion.       Of left arm tenderness to palpation. Pain with range of motion. Radial pulses 2/2.  Neurological: She is alert and oriented to person, place, and time.  Skin: Skin is warm and dry.  Psychiatric: She has a normal mood and affect.          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 Will tear and 75 mg twice a day with food. Flexeril 10 mg 3 times a day. Warned of drowsiness. Patient only office if symptoms worsen or persist. Recheck as scheduled and when necessary.

## 2011-12-30 ENCOUNTER — Encounter: Payer: BC Managed Care – PPO | Admitting: Family Medicine

## 2011-12-30 DIAGNOSIS — Z0289 Encounter for other administrative examinations: Secondary | ICD-10-CM

## 2012-01-30 ENCOUNTER — Ambulatory Visit (INDEPENDENT_AMBULATORY_CARE_PROVIDER_SITE_OTHER): Payer: BC Managed Care – PPO | Admitting: Licensed Clinical Social Worker

## 2012-01-30 DIAGNOSIS — F331 Major depressive disorder, recurrent, moderate: Secondary | ICD-10-CM

## 2012-01-30 DIAGNOSIS — F411 Generalized anxiety disorder: Secondary | ICD-10-CM

## 2012-02-12 ENCOUNTER — Ambulatory Visit (INDEPENDENT_AMBULATORY_CARE_PROVIDER_SITE_OTHER): Payer: BC Managed Care – PPO | Admitting: Licensed Clinical Social Worker

## 2012-02-12 DIAGNOSIS — F411 Generalized anxiety disorder: Secondary | ICD-10-CM

## 2012-02-12 DIAGNOSIS — F331 Major depressive disorder, recurrent, moderate: Secondary | ICD-10-CM

## 2012-03-03 ENCOUNTER — Telehealth: Payer: Self-pay | Admitting: *Deleted

## 2012-03-03 ENCOUNTER — Encounter: Payer: Self-pay | Admitting: Family Medicine

## 2012-03-03 ENCOUNTER — Ambulatory Visit (INDEPENDENT_AMBULATORY_CARE_PROVIDER_SITE_OTHER): Payer: BC Managed Care – PPO | Admitting: Family Medicine

## 2012-03-03 VITALS — BP 108/68 | HR 81 | Temp 98.4°F | Wt 162.0 lb

## 2012-03-03 DIAGNOSIS — N390 Urinary tract infection, site not specified: Secondary | ICD-10-CM

## 2012-03-03 DIAGNOSIS — R635 Abnormal weight gain: Secondary | ICD-10-CM

## 2012-03-03 LAB — POCT URINALYSIS DIPSTICK
Bilirubin, UA: NEGATIVE
Ketones, UA: NEGATIVE
Spec Grav, UA: 1.02

## 2012-03-03 LAB — BASIC METABOLIC PANEL
BUN: 12 mg/dL (ref 6–23)
CO2: 24 mEq/L (ref 19–32)
Chloride: 107 mEq/L (ref 96–112)
Creatinine, Ser: 0.7 mg/dL (ref 0.4–1.2)
Glucose, Bld: 82 mg/dL (ref 70–99)
Sodium: 141 mEq/L (ref 135–145)

## 2012-03-03 LAB — CBC WITH DIFFERENTIAL/PLATELET
Basophils Absolute: 0 10*3/uL (ref 0.0–0.1)
Eosinophils Absolute: 0.2 10*3/uL (ref 0.0–0.7)
Hemoglobin: 13.9 g/dL (ref 12.0–15.0)
Neutro Abs: 4.4 10*3/uL (ref 1.4–7.7)

## 2012-03-03 MED ORDER — CIPROFLOXACIN HCL 500 MG PO TABS: 500.0000 mg | ORAL_TABLET | Freq: Two times a day (BID) | ORAL | Status: AC

## 2012-03-03 NOTE — Progress Notes (Signed)
  Subjective:    Patient ID: Julia Anderson, female    DOB: 1986/04/30, 26 y.o.   MRN: [id number]  HPI Here for 3 days of urinary urgency and burning. No fever or nausea. She also mentions some recent weight gain with no change in diet or exercise routine.    Review of Systems  Constitutional: Positive for unexpected weight change.  Genitourinary: Positive for dysuria and urgency. Negative for hematuria, flank pain and pelvic pain.       Objective:   Physical Exam  Constitutional: She appears well-developed and well-nourished.  Abdominal: Soft. Bowel sounds are normal. She exhibits no distension and no mass. There is no tenderness. There is no rebound and no guarding.          Assessment & Plan:  Treat the UTI. Check labs

## 2012-03-03 NOTE — Progress Notes (Signed)
Addended by: Aniceto Boss A on: 03/03/2012 02:27 PM   Modules accepted: Orders

## 2012-03-03 NOTE — Telephone Encounter (Signed)
Asking for RX for UTI.  Will come in to check UA and treatment.

## 2012-03-09 NOTE — Progress Notes (Signed)
Quick Note:  I spoke with pt ______ 

## 2012-03-26 ENCOUNTER — Ambulatory Visit (INDEPENDENT_AMBULATORY_CARE_PROVIDER_SITE_OTHER): Payer: BC Managed Care – PPO | Admitting: Licensed Clinical Social Worker

## 2012-03-26 DIAGNOSIS — F331 Major depressive disorder, recurrent, moderate: Secondary | ICD-10-CM

## 2012-03-26 DIAGNOSIS — F411 Generalized anxiety disorder: Secondary | ICD-10-CM

## 2012-03-31 ENCOUNTER — Ambulatory Visit (INDEPENDENT_AMBULATORY_CARE_PROVIDER_SITE_OTHER): Payer: BC Managed Care – PPO | Admitting: Licensed Clinical Social Worker

## 2012-03-31 DIAGNOSIS — F411 Generalized anxiety disorder: Secondary | ICD-10-CM

## 2012-03-31 DIAGNOSIS — F331 Major depressive disorder, recurrent, moderate: Secondary | ICD-10-CM

## 2012-05-11 ENCOUNTER — Encounter: Payer: Self-pay | Admitting: Family Medicine

## 2012-05-11 ENCOUNTER — Ambulatory Visit (INDEPENDENT_AMBULATORY_CARE_PROVIDER_SITE_OTHER): Payer: BC Managed Care – PPO | Admitting: Family Medicine

## 2012-05-11 VITALS — BP 108/70 | HR 87 | Temp 98.9°F | Wt 157.0 lb

## 2012-05-11 DIAGNOSIS — N390 Urinary tract infection, site not specified: Secondary | ICD-10-CM

## 2012-05-11 LAB — POCT URINALYSIS DIPSTICK
Bilirubin, UA: NEGATIVE
Blood, UA: NEGATIVE

## 2012-05-11 MED ORDER — CIPROFLOXACIN HCL 500 MG PO TABS: 500.0000 mg | ORAL_TABLET | Freq: Two times a day (BID) | ORAL | Status: AC

## 2012-05-11 NOTE — Progress Notes (Signed)
  Subjective:    Patient ID: Julia Anderson, female    DOB: [birth year], 26 y.o.   MRN: [id number]  HPI Here for 3 days of urinary burning and urgency. No nausea or fever.    Review of Systems  Constitutional: Negative.   Gastrointestinal: Negative.   Genitourinary: Positive for dysuria, urgency and frequency. Negative for flank pain and difficulty urinating.       Objective:   Physical Exam  Constitutional: She appears well-developed and well-nourished.  Abdominal: Soft. Bowel sounds are normal. She exhibits no distension and no mass. There is no tenderness. There is no rebound and no guarding.          Assessment & Plan:  Drink plenty of water. Culture the urine

## 2012-05-14 LAB — URINE CULTURE: Colony Count: >=100000

## 2012-05-15 NOTE — Progress Notes (Signed)
Quick Note:  I left voice message with results. ______ 

## 2012-09-25 ENCOUNTER — Other Ambulatory Visit: Payer: Self-pay | Admitting: Family Medicine

## 2012-09-28 NOTE — Telephone Encounter (Signed)
Okay for one year  

## 2012-11-28 ENCOUNTER — Encounter (HOSPITAL_COMMUNITY): Payer: Self-pay | Admitting: Emergency Medicine

## 2012-11-28 ENCOUNTER — Emergency Department (HOSPITAL_COMMUNITY)
Admission: EM | Admit: 2012-11-28 | Discharge: 2012-11-28 | Disposition: A | Discharge status: Home / Self Care | Payer: BC Managed Care – PPO | Attending: Emergency Medicine | Admitting: Emergency Medicine

## 2012-11-28 DIAGNOSIS — G8389 Other specified paralytic syndromes: Secondary | ICD-10-CM | POA: Insufficient documentation

## 2012-11-28 DIAGNOSIS — F411 Generalized anxiety disorder: Secondary | ICD-10-CM | POA: Insufficient documentation

## 2012-11-28 DIAGNOSIS — F419 Anxiety disorder, unspecified: Secondary | ICD-10-CM

## 2012-11-28 DIAGNOSIS — J45909 Unspecified asthma, uncomplicated: Secondary | ICD-10-CM | POA: Insufficient documentation

## 2012-11-28 MED ORDER — HYDROXYZINE HCL 25 MG PO TABS: 25.0000 mg | ORAL_TABLET | Freq: Four times a day (QID) | ORAL | Status: DC

## 2012-11-28 NOTE — ED Provider Notes (Signed)
History     CSN: [id number]  Arrival date & time 11/28/12  1053   First MD Initiated Contact with Patient 11/28/12 1108      Chief Complaint  Patient presents with  . Anxiety    (Consider location/radiation/quality/duration/timing/severity/associated sxs/prior treatment) HPI  Julia Anderson is a 27 y.o. female complaining of increasing nervousness over the course last 6 days. Patient states she had panic attacks while she was in middle school. She's not had an issue with this in several years. She has no reason especially to be nervous at this time. Patient denies any chest pain, palpitations, shortness of breath, psychiatric diagnoses or depression.  Past Medical History  Diagnosis Date  . Allergy   . Asthma   . Cerebral palsy     Past Surgical History  Procedure Date  . Foot capsule release w/ percutaneous heel cord lengthening, tibial tendon transfer     lengthen a tight heel cord left leg    Family History  Problem Relation Age of Onset  . Suicidality Mother   . Cirrhosis Father   . Cancer Paternal Aunt     breast  . Diabetes Paternal Aunt   . Diabetes Paternal Grandfather   . Cancer Paternal Grandfather     prostate    History  Substance Use Topics  . Smoking status: Never Smoker   . Smokeless tobacco: Never Used  . Alcohol Use: Yes     Comment: once a month    OB History    Grav Para Term Preterm Abortions TAB SAB Ect Mult Living   1 0   1 1    0      Review of Systems  Constitutional: Negative for fever.  Respiratory: Negative for shortness of breath.   Cardiovascular: Negative for chest pain.  Gastrointestinal: Negative for nausea, vomiting, abdominal pain and diarrhea.  Psychiatric/Behavioral: Positive for agitation.  All other systems reviewed and are negative.    Allergies  Iohexol  Home Medications  No current outpatient prescriptions on file.  BP 126/80  Pulse 72  Temp 98.2 F (36.8 C) (Oral)  Resp 16  SpO2 100%  LMP  11/25/2012  Physical Exam  Nursing note and vitals reviewed. Constitutional: She is oriented to person, place, and time. She appears well-developed and well-nourished. No distress.  HENT:  Head: Normocephalic and atraumatic.  Right Ear: External ear normal.  Left Ear: External ear normal.  Mouth/Throat: Oropharynx is clear and moist.  Eyes: Conjunctivae normal and EOM are normal.  Neck: Normal range of motion.  Cardiovascular: Normal rate.   Pulmonary/Chest: Effort normal and breath sounds normal. No stridor. No respiratory distress. She has no wheezes. She has no rales. She exhibits no tenderness.  Musculoskeletal: Normal range of motion.  Neurological: She is alert and oriented to person, place, and time.  Psychiatric: Judgment normal. Her mood appears anxious. Cognition and memory are normal.       Mild agitation, normal insight, speech is not pressured.    ED Course  Procedures (including critical care time)  Labs Reviewed - No data to display No results found.   1. Anxiety       MDM     Filed Vitals:   11/28/12 1057  BP: 126/80  Pulse: 72  Temp: 98.2 F (36.8 C)  TempSrc: Oral  Resp: 16  SpO2: 100%     Pt verbalized understanding and agrees with care plan. Outpatient follow-up and return precautions given.    New Prescriptions  HYDROXYZINE (ATARAX/VISTARIL) 25 MG TABLET    Take 1 tablet (25 mg total) by mouth every 6 (six) hours.     Wynetta Emery, PA-C 11/28/12 2055

## 2012-11-28 NOTE — ED Notes (Signed)
Pt. Stated, I feel nervous for no reason since Monday.

## 2012-11-28 NOTE — ED Notes (Signed)
Pt c/o nervousness, feeling "bad..my body feels heavy...once in a while I have brief chest pain..mild nausea.Marland Kitchendecreased appetite". States has hx of "panic attacks". States her head feels "woozy".

## 2012-11-29 NOTE — ED Provider Notes (Signed)
Medical screening examination/treatment/procedure(s) were performed by non-physician practitioner and as supervising physician I was immediately available for consultation/collaboration.   Suzi Roots, MD 11/29/12 1450

## 2012-12-01 ENCOUNTER — Ambulatory Visit (INDEPENDENT_AMBULATORY_CARE_PROVIDER_SITE_OTHER): Payer: BC Managed Care – PPO | Admitting: Family Medicine

## 2012-12-01 ENCOUNTER — Encounter: Payer: Self-pay | Admitting: Family Medicine

## 2012-12-01 VITALS — BP 108/68 | HR 95 | Temp 98.6°F | Wt 158.0 lb

## 2012-12-01 DIAGNOSIS — N760 Acute vaginitis: Secondary | ICD-10-CM

## 2012-12-01 DIAGNOSIS — F419 Anxiety disorder, unspecified: Secondary | ICD-10-CM

## 2012-12-01 DIAGNOSIS — A499 Bacterial infection, unspecified: Secondary | ICD-10-CM

## 2012-12-01 DIAGNOSIS — F411 Generalized anxiety disorder: Secondary | ICD-10-CM

## 2012-12-01 DIAGNOSIS — B9689 Other specified bacterial agents as the cause of diseases classified elsewhere: Secondary | ICD-10-CM

## 2012-12-01 LAB — HEPATIC FUNCTION PANEL
ALT: 20 U/L (ref 0–35)
AST: 19 U/L (ref 0–37)
Alkaline Phosphatase: 54 U/L (ref 39–117)
Bilirubin, Direct: 0.1 mg/dL (ref 0.0–0.3)
Total Bilirubin: 0.9 mg/dL (ref 0.3–1.2)

## 2012-12-01 LAB — BASIC METABOLIC PANEL
Chloride: 108 mEq/L (ref 96–112)
GFR: 123.14 mL/min (ref >60.00)
Potassium: 4 mEq/L (ref 3.5–5.1)
Sodium: 140 mEq/L (ref 135–145)

## 2012-12-01 LAB — CBC WITH DIFFERENTIAL/PLATELET
Basophils Absolute: 0 10*3/uL (ref 0.0–0.1)
HCT: 42.7 % (ref 36.0–46.0)
Hemoglobin: 14.8 g/dL (ref 12.0–15.0)
Lymphs Abs: 1 10*3/uL (ref 0.7–4.0)
MCV: 94.6 fl (ref 78.0–100.0)
Monocytes Absolute: 0.4 10*3/uL (ref 0.1–1.0)
Neutro Abs: 3.8 10*3/uL (ref 1.4–7.7)
Platelets: 221 10*3/uL (ref 150.0–400.0)
RDW: 13.2 % (ref 11.5–14.6)

## 2012-12-01 MED ORDER — ESCITALOPRAM OXALATE 10 MG PO TABS: 10.0000 mg | ORAL_TABLET | Freq: Every day | ORAL | Status: DC

## 2012-12-01 MED ORDER — METRONIDAZOLE 500 MG PO TABS: 500.0000 mg | ORAL_TABLET | Freq: Three times a day (TID) | ORAL | Status: DC

## 2012-12-01 NOTE — Progress Notes (Signed)
  Subjective:    Patient ID: Julia Anderson, female    DOB: 05-28-1986, 27 y.o.   MRN: [id number]  HPI Here for possible anxiety reactions. In the past week she has had frequent spells of feeling shaky, anxious, SOB, mild HAs, and rapid heart beats. She admits to using a lot of caffeine but she stopped all caffeinated drinks 5 days ago. She went to the ER on 11-28-12 and had a normal exam and EKG. She then went to a [facility] clinic the next day and had a normal head CT. Everyone has told her this is due to anxiety. She admits to having some arguments with her family recently. She also has had several days of a light brown vaginal DC with a fishy odor. Her last menstrual cycle ended 3 days ago.    Review of Systems  Constitutional: Negative.   Respiratory: Positive for chest tightness and shortness of breath. Negative for cough and wheezing.   Cardiovascular: Positive for palpitations. Negative for chest pain and leg swelling.  Gastrointestinal: Negative.   Neurological: Negative.   Psychiatric/Behavioral: The patient is nervous/anxious.        Objective:   Physical Exam  Constitutional: She is oriented to person, place, and time. She appears well-developed and well-nourished. No distress.  Cardiovascular: Normal rate, regular rhythm, normal heart sounds and intact distal pulses.   Pulmonary/Chest: Effort normal and breath sounds normal.  Lymphadenopathy:    She has no cervical adenopathy.  Neurological: She is alert and oriented to person, place, and time.  Psychiatric: She has a normal mood and affect. Her behavior is normal. Thought content normal.          Assessment & Plan:  This is probably all due to anxiety. Get labs today. Start on Lexapro 10 mg daily. Given Flagyl for 7 days. Recheck in 3 weeks

## 2012-12-02 ENCOUNTER — Ambulatory Visit: Payer: BC Managed Care – PPO | Admitting: Family Medicine

## 2012-12-04 NOTE — Progress Notes (Signed)
Quick Note:  Pt informed ______ 

## 2012-12-09 ENCOUNTER — Telehealth: Payer: Self-pay | Admitting: Family Medicine

## 2012-12-09 NOTE — Telephone Encounter (Signed)
Patient Information:  Caller Name: Prisha  Phone: [phone]  Patient: Julia Anderson, Julia Anderson  Gender: Female  DOB: [birth year]  Age: 27 Years  PCP: [facility])  Pregnant: No  Office Follow Up:  Does the office need to follow up with this patient?: Yes  Instructions For The Office: OFFICE FOLLOWUP REQUEST -- PT HAD TO STOP LEXAPRO AND WANTS TO KNOW WHAT DR. Clent Ridges WOULD SUGGEST -- PT IS AWARE THAT OFFICE WILL BE CLOSED 12/10/2012 AND IS FINE WITH CALL BACK 12/11/2012   RN Note:  Pt stating since stopping Lexapro , she is  sleeping better.  She wants to know what she should do about taking Lexapro. RN questioned if she could try taking in the morning , but she stated it was easier for her to take at night with work schedule.  Symptoms  Reason For Call & Symptoms: Pt calling with problems with sleeping when she started taking Lexapro  12/01/2012. She stopped taking after 5 days because trouble with falling and staying asleep along with sweating. Last dose 02/0/05/2013.  Reviewed Health History In EMR: Yes  Reviewed Medications In EMR: Yes  Reviewed Allergies In EMR: Yes  Reviewed Surgeries / Procedures: Yes  Date of Onset of Symptoms: 12/05/2012  Treatments Tried: Stopped Lexapro and Decreased caffiene  Treatments Tried Worked: Yes OB / GYN:  LMP: 11/24/2012  Guideline(s) Used:  Insomnia  Disposition Per Guideline:   Home Care  Reason For Disposition Reached:   Difficulty falling to sleep or staying asleep  Advice Given:  Tips For Good Sleep - When You Can  If you can't fall asleep after 30 minutes, get out of bed and do something relaxing.  Read a book or listen to some soothing music.  When you feel sleepy, go back to bed.  Call Back If:  You have any other questions.

## 2012-12-09 NOTE — Telephone Encounter (Signed)
I would advise that she find a way to remember to take the Lexapro in the mornings. Whatever works for her

## 2012-12-09 NOTE — Telephone Encounter (Signed)
Pt aware.

## 2012-12-25 ENCOUNTER — Encounter: Payer: Self-pay | Admitting: Family Medicine

## 2012-12-25 ENCOUNTER — Ambulatory Visit (INDEPENDENT_AMBULATORY_CARE_PROVIDER_SITE_OTHER): Payer: BC Managed Care – PPO | Admitting: Family Medicine

## 2012-12-25 VITALS — BP 102/66 | HR 68 | Temp 98.1°F | Wt 157.0 lb

## 2012-12-25 DIAGNOSIS — L853 Xerosis cutis: Secondary | ICD-10-CM

## 2012-12-25 DIAGNOSIS — F411 Generalized anxiety disorder: Secondary | ICD-10-CM

## 2012-12-25 DIAGNOSIS — L738 Other specified follicular disorders: Secondary | ICD-10-CM

## 2012-12-25 MED ORDER — LORAZEPAM 0.5 MG PO TABS
0.5000 mg | ORAL_TABLET | Freq: Two times a day (BID) | ORAL | Status: DC | PRN
Start: 2012-12-25 — End: 2014-04-27

## 2012-12-25 NOTE — Progress Notes (Signed)
  Subjective:    Patient ID: Julia Anderson, female    DOB: July 07, 1986, 27 y.o.   MRN: [id number]  HPI Here for several issues. We met a few weeks ago to talk about her anxiety and she tried lexapro. Sh ehad to stop this because it made her feel shaky and sweaty. She still gets anxious but does not think she needs a daily medication. Also she describes her skin feeling dry and her hair being brittle. We did labs a few weeks ago and these were all normal, including a Hgb and TSH.    Review of Systems  Constitutional: Negative.   Respiratory: Negative.   Cardiovascular: Negative.        Objective:   Physical Exam  Constitutional: She is oriented to person, place, and time. She appears well-developed and well-nourished.  Neurological: She is alert and oriented to person, place, and time.  Skin: Skin is warm and dry.  Psychiatric: She has a normal mood and affect. Her behavior is normal. Thought content normal.          Assessment & Plan:  For the anxiety try Lorazepam on a prn basis. For the skin and hair get back on a daily mulitvitamin and add Biotin. Use a humidifier in the home.

## 2013-01-20 ENCOUNTER — Ambulatory Visit (INDEPENDENT_AMBULATORY_CARE_PROVIDER_SITE_OTHER): Payer: BC Managed Care – PPO | Admitting: Family Medicine

## 2013-01-20 ENCOUNTER — Encounter: Payer: Self-pay | Admitting: Family Medicine

## 2013-01-20 ENCOUNTER — Other Ambulatory Visit (HOSPITAL_COMMUNITY)
Admission: RE | Admit: 2013-01-20 | Discharge: 2013-01-20 | Disposition: A | Discharge status: Home / Self Care | Payer: BC Managed Care – PPO | Source: Ambulatory Visit | Attending: Family Medicine | Admitting: Family Medicine

## 2013-01-20 VITALS — BP 102/70 | HR 79 | Temp 98.4°F | Ht 68.0 in | Wt 157.0 lb

## 2013-01-20 DIAGNOSIS — Z0000 Encounter for general adult medical examination without abnormal findings: Secondary | ICD-10-CM

## 2013-01-20 DIAGNOSIS — Z01419 Encounter for gynecological examination (general) (routine) without abnormal findings: Secondary | ICD-10-CM | POA: Insufficient documentation

## 2013-01-20 LAB — BASIC METABOLIC PANEL
BUN: 14 mg/dL (ref 6–23)
CO2: 27 mEq/L (ref 19–32)
Chloride: 105 mEq/L (ref 96–112)
Glucose, Bld: 84 mg/dL (ref 70–99)
Potassium: 4.9 mEq/L (ref 3.5–5.1)

## 2013-01-20 LAB — CBC WITH DIFFERENTIAL/PLATELET
Basophils Absolute: 0 10*3/uL (ref 0.0–0.1)
Eosinophils Absolute: 0.2 10*3/uL (ref 0.0–0.7)
Hemoglobin: 13.7 g/dL (ref 12.0–15.0)
Lymphocytes Relative: 24.7 % (ref 12.0–46.0)
MCHC: 34.1 g/dL (ref 30.0–36.0)
MCV: 96 fl (ref 78.0–100.0)
Monocytes Absolute: 0.3 10*3/uL (ref 0.1–1.0)
Neutro Abs: 2.8 10*3/uL (ref 1.4–7.7)
Neutrophils Relative %: 65.1 % (ref 43.0–77.0)
RDW: 13.6 % (ref 11.5–14.6)

## 2013-01-20 LAB — POCT URINALYSIS DIPSTICK
Nitrite, UA: NEGATIVE
Protein, UA: NEGATIVE
pH, UA: 5.5

## 2013-01-20 LAB — HEPATIC FUNCTION PANEL
ALT: 17 U/L (ref 0–35)
AST: 20 U/L (ref 0–37)
Albumin: 4.1 g/dL (ref 3.5–5.2)

## 2013-01-20 LAB — LIPID PANEL
Cholesterol: 160 mg/dL (ref 0–200)
VLDL: 8.8 mg/dL (ref 0.0–40.0)

## 2013-01-20 NOTE — Progress Notes (Signed)
  Subjective:    Patient ID: Julia Anderson, female    DOB: [birth year], 27 y.o.   MRN: [id number]  HPI 27 yr old female for a cpx. She feels well. We recently gave her some Lorazepam to use for anxiety, and this works well for her.    Review of Systems  Constitutional: Negative.   HENT: Negative.   Eyes: Negative.   Respiratory: Negative.   Cardiovascular: Negative.   Gastrointestinal: Negative.   Genitourinary: Negative for dysuria, urgency, frequency, hematuria, flank pain, decreased urine volume, enuresis, difficulty urinating, pelvic pain and dyspareunia.  Musculoskeletal: Negative.   Skin: Negative.   Neurological: Negative.   Psychiatric/Behavioral: Negative.        Objective:   Physical Exam  Constitutional: She is oriented to person, place, and time. She appears well-developed and well-nourished. No distress.  HENT:  Head: Normocephalic and atraumatic.  Right Ear: External ear normal.  Left Ear: External ear normal.  Nose: Nose normal.  Mouth/Throat: Oropharynx is clear and moist. No oropharyngeal exudate.  Eyes: Conjunctivae and EOM are normal. Pupils are equal, round, and reactive to light. No scleral icterus.  Neck: Normal range of motion. Neck supple. No JVD present. No thyromegaly present.  Cardiovascular: Normal rate, regular rhythm, normal heart sounds and intact distal pulses.  Exam reveals no gallop and no friction rub.   No murmur heard. Pulmonary/Chest: Effort normal and breath sounds normal. No respiratory distress. She has no wheezes. She has no rales. She exhibits no tenderness.  Abdominal: Soft. Bowel sounds are normal. She exhibits no distension and no mass. There is no tenderness. There is no rebound and no guarding.  Musculoskeletal: Normal range of motion. She exhibits no edema and no tenderness.  Lymphadenopathy:    She has no cervical adenopathy.  Neurological: She is alert and oriented to person, place, and time. She has normal reflexes. No  cranial nerve deficit. She exhibits normal muscle tone. Coordination normal.  Skin: Skin is warm and dry. No rash noted. No erythema.  Psychiatric: She has a normal mood and affect. Her behavior is normal. Judgment and thought content normal.          Assessment & Plan:  Well exam. Get fasting labs

## 2013-01-20 NOTE — Addendum Note (Signed)
Addended by: Aniceto Boss A on: 01/20/2013 10:24 AM   Modules accepted: Orders

## 2013-01-21 NOTE — Progress Notes (Signed)
Quick Note:  I left voice message with results. ______ 

## 2013-01-25 NOTE — Progress Notes (Signed)
Quick Note:  I left voice message with results. ______ 

## 2013-04-28 ENCOUNTER — Ambulatory Visit (INDEPENDENT_AMBULATORY_CARE_PROVIDER_SITE_OTHER): Payer: BC Managed Care – PPO | Admitting: Licensed Clinical Social Worker

## 2013-04-28 DIAGNOSIS — F411 Generalized anxiety disorder: Secondary | ICD-10-CM

## 2013-04-28 DIAGNOSIS — F331 Major depressive disorder, recurrent, moderate: Secondary | ICD-10-CM

## 2013-05-04 ENCOUNTER — Ambulatory Visit (INDEPENDENT_AMBULATORY_CARE_PROVIDER_SITE_OTHER): Payer: BC Managed Care – PPO | Admitting: Licensed Clinical Social Worker

## 2013-05-04 DIAGNOSIS — F411 Generalized anxiety disorder: Secondary | ICD-10-CM

## 2013-05-04 DIAGNOSIS — F331 Major depressive disorder, recurrent, moderate: Secondary | ICD-10-CM

## 2013-05-20 ENCOUNTER — Ambulatory Visit: Payer: BC Managed Care – PPO | Admitting: Licensed Clinical Social Worker

## 2013-05-27 ENCOUNTER — Ambulatory Visit (INDEPENDENT_AMBULATORY_CARE_PROVIDER_SITE_OTHER): Payer: BC Managed Care – PPO | Admitting: Family Medicine

## 2013-05-27 ENCOUNTER — Encounter: Payer: Self-pay | Admitting: Family Medicine

## 2013-05-27 VITALS — BP 126/70 | HR 89 | Temp 98.5°F | Wt 166.0 lb

## 2013-05-27 DIAGNOSIS — G43909 Migraine, unspecified, not intractable, without status migrainosus: Secondary | ICD-10-CM

## 2013-05-27 MED ORDER — SUMATRIPTAN SUCCINATE 100 MG PO TABS: 100.0000 mg | ORAL_TABLET | ORAL | Status: DC | PRN

## 2013-05-27 MED ORDER — HYDROCODONE-ACETAMINOPHEN 5-325 MG PO TABS: 1.0000 | ORAL_TABLET | ORAL | Status: DC | PRN

## 2013-05-27 NOTE — Progress Notes (Signed)
  Subjective:    Patient ID: Julia Anderson, female    DOB: [birth year], 27 y.o.   MRN: [id number]  HPI Here for migraines, which have been coming more frequently. She averages 1 or 2 a week. They start in the temples and then spread to the top of the head. Stress and bright sunlight often trigger them. She used Imitrex as a teenager but not for some years now. Currently takes Advil or Excedrin.    Review of Systems  Constitutional: Negative.   Eyes: Negative.   Neurological: Positive for headaches.       Objective:   Physical Exam  Constitutional: She is oriented to person, place, and time. She appears well-developed and well-nourished.  HENT:  Head: Normocephalic and atraumatic.  Eyes: Conjunctivae are normal. Pupils are equal, round, and reactive to light.  Neurological: She is alert and oriented to person, place, and time. She has normal reflexes. No cranial nerve deficit. She exhibits normal muscle tone. Coordination normal.          Assessment & Plan:  Try Imitrex prn. Use Vicodin for a backup med.

## 2013-06-17 ENCOUNTER — Ambulatory Visit: Payer: BC Managed Care – PPO | Admitting: Obstetrics & Gynecology

## 2013-06-23 ENCOUNTER — Ambulatory Visit: Payer: BC Managed Care – PPO | Admitting: Obstetrics & Gynecology

## 2013-06-23 DIAGNOSIS — Z124 Encounter for screening for malignant neoplasm of cervix: Secondary | ICD-10-CM

## 2013-10-06 ENCOUNTER — Telehealth: Payer: Self-pay | Admitting: Family Medicine

## 2013-10-06 NOTE — Telephone Encounter (Signed)
Refill request for Ortho-Cyclen or Ortho-Tricyclen due to cost ( $12 per month ) Is this something you prescribe? Also send to [employer] [facility].

## 2013-10-07 NOTE — Telephone Encounter (Signed)
Call in Ortho-Tricyclen for one year

## 2013-10-08 MED ORDER — NORGESTIM-ETH ESTRAD TRIPHASIC 0.18/0.215/0.25 MG-35 MCG PO TABS: 1.0000 | ORAL_TABLET | Freq: Every day | ORAL | Status: DC

## 2013-10-08 NOTE — Telephone Encounter (Signed)
I sent script e-scribe. 

## 2013-10-11 ENCOUNTER — Encounter: Payer: Self-pay | Admitting: *Deleted

## 2013-10-12 ENCOUNTER — Ambulatory Visit (INDEPENDENT_AMBULATORY_CARE_PROVIDER_SITE_OTHER): Payer: BC Managed Care – PPO | Admitting: Family Medicine

## 2013-10-12 ENCOUNTER — Encounter: Payer: Self-pay | Admitting: Family Medicine

## 2013-10-12 VITALS — BP 120/74 | HR 83 | Temp 99.3°F | Wt 180.0 lb

## 2013-10-12 DIAGNOSIS — G43909 Migraine, unspecified, not intractable, without status migrainosus: Secondary | ICD-10-CM

## 2013-10-12 MED ORDER — TOPIRAMATE 25 MG PO TABS: 25.0000 mg | ORAL_TABLET | Freq: Every day | ORAL | Status: DC

## 2013-10-12 NOTE — Progress Notes (Signed)
Pre visit review using our clinic review tool, if applicable. No additional management support is needed unless otherwise documented below in the visit note. 

## 2013-10-12 NOTE — Progress Notes (Signed)
   Subjective:    Patient ID: Julia Anderson, female    DOB: April 14, 1986, 27 y.o.   MRN: [id number]  HPI Here to discuss her migraines. Imitrex helps but she is getting these more frequently, sometimes 2 or 3 a week. She describes "tingling" on the left back of the head and the left temple, and her HAs often originate there. She gets nauseated with them but usually does not vomit. She had an eye exam recently.    Review of Systems  Constitutional: Negative.   Eyes: Negative.   Neurological: Positive for headaches. Negative for dizziness, tremors, seizures, syncope, facial asymmetry, speech difficulty, weakness, light-headedness and numbness.       Objective:   Physical Exam  Constitutional: She is oriented to person, place, and time. She appears well-developed and well-nourished. No distress.  Eyes: Conjunctivae are normal. Pupils are equal, round, and reactive to light.  Neurological: She is alert and oriented to person, place, and time. No cranial nerve deficit.          Assessment & Plan:  Start on suppressive therapy with Topamax 25 mg daily at bedtime. Recheck 2 weeks

## 2013-11-16 ENCOUNTER — Other Ambulatory Visit: Payer: Self-pay | Admitting: Family Medicine

## 2013-12-10 ENCOUNTER — Other Ambulatory Visit: Payer: Self-pay | Admitting: Family Medicine

## 2013-12-13 NOTE — Telephone Encounter (Signed)
Per Dr. Fry, okay to refill for 1 year and I did send script e-scribe. 

## 2013-12-22 ENCOUNTER — Telehealth: Payer: Self-pay | Admitting: Family Medicine

## 2013-12-22 ENCOUNTER — Ambulatory Visit (INDEPENDENT_AMBULATORY_CARE_PROVIDER_SITE_OTHER): Payer: Self-pay | Admitting: Family

## 2013-12-22 ENCOUNTER — Encounter: Payer: Self-pay | Admitting: Family

## 2013-12-22 VITALS — BP 120/78 | HR 69 | Temp 98.3°F | Wt 179.0 lb

## 2013-12-22 DIAGNOSIS — F411 Generalized anxiety disorder: Secondary | ICD-10-CM

## 2013-12-22 DIAGNOSIS — R4586 Emotional lability: Secondary | ICD-10-CM

## 2013-12-22 DIAGNOSIS — F39 Unspecified mood [affective] disorder: Secondary | ICD-10-CM

## 2013-12-22 LAB — TSH: TSH: 1.8 u[IU]/mL (ref 0.35–5.50)

## 2013-12-22 MED ORDER — BUPROPION HCL ER (XL) 150 MG PO TB24: 150.0000 mg | ORAL_TABLET | Freq: Every day | ORAL | Status: DC

## 2013-12-22 NOTE — Progress Notes (Signed)
Pre visit review using our clinic review tool, if applicable. No additional management support is needed unless otherwise documented below in the visit note. 

## 2013-12-22 NOTE — Telephone Encounter (Signed)
Patient Information:  Caller Name: [name]  Phone: [phone]  Patient: Julia Anderson, Julia Anderson  Gender: Female  DOB: 05/10/1986  Age: 28 Years  PCP: Gershon CraneFry, [facility](Family Practice)  Pregnant: No  Office Follow Up:  Does the office need to follow up with this patient?: No  Instructions For The Office: N/A   Symptoms  Reason For Call & Symptoms: Last bm on 12/20/13. Today abdomen feels full and has sharp pain if she pushes on abdomen. Takes fiber gummies daily and MVI daily. She has been tired and sluggish for past 2-3 weeks. She is getting headaches on and off and feeling anxious at times and trouble focusing. She is concerned that BCP maybe causing some of her sx and is concerned about her blood pressure.   Reviewed Health History In EMR: Yes  Reviewed Medications In EMR: Yes  Reviewed Allergies In EMR: Yes  Reviewed Surgeries / Procedures: Yes  Date of Onset of Symptoms: 12/20/2013 OB / GYN:  LMP: 12/13/2013  Guideline(s) Used:  Constipation  Abdominal Pain - Female  Disposition Per Guideline:   See Today in Office  Reason For Disposition Reached:   Moderate or mild pain that comes and goes (cramps) lasts > 24 hours  Advice Given:  Reassurance:  A mild stomachache can be caused by indigestion, gas pains or overeating. Sometimes a stomachache signals the onset of a vomiting illness due to a viral gastroenteritis ("stomach flu").  Here is some care advice that should help.  Rest:  Lie down and rest until you feel better.  Fluids:  Sip clear fluids only (e.g., water, flat soft drinks or 1/2 strength fruit juice) until the pain has been gone for over 2 hours. Then slowly return to a regular diet.  Diet:  Slowly advance diet from clear liquids to a bland diet  Avoid alcohol or caffeinated beverages  Avoid greasy or fatty foods.  Pass a BM:  Sit on the toilet and try to pass a bowel movement (BM). Do not strain. This may relieve the pain if it is due to constipation or impending  diarrhea.  Expected Course:  With harmless causes, the pain is usually better or goes away within 2 hours. With viral gastroenteritis ("stomach flu"), belly cramps may precede each bout of vomiting or diarrhea and may last 2-3 days. With serious causes (such as appendicitis) the pain becomes constant and more severe.  Call Back If:  Abdominal pain is constant and present for more than 2 hours  You are pregnant  You become worse.  Patient Will Follow Care Advice:  YES  Appointment Scheduled:  12/22/2013 14:45:00 Appointment Scheduled Provider:  Adline Mangoampbell, [facility](Family Practice)

## 2013-12-22 NOTE — Patient Instructions (Signed)
Bipolar Disorder °Bipolar disorder is a mental illness. The term bipolar disorder actually is used to describe a group of disorders that all share varying degrees of emotional highs and lows that can interfere with daily functioning, such as work, school, or relationships. Bipolar disorder also can lead to drug abuse, hospitalization, and suicide. °The emotional highs of bipolar disorder are periods of elation or irritability and high energy. These highs can range from a mild form (hypomania) to a severe form (mania). People experiencing episodes of hypomania may appear energetic, excitable, and highly productive. People experiencing mania may behave impulsively or erratically. They often make poor decisions. They may have difficulty sleeping. The most severe episodes of mania can involve having very distorted beliefs or perceptions about the world and seeing or hearing things that are not real (psychotic delusions and hallucinations).  °The emotional lows of bipolar disorder (depression) also can range from mild to severe. Severe episodes of bipolar depression can involve psychotic delusions and hallucinations. °Sometimes people with bipolar disorder experience a state of mixed mood. Symptoms of hypomania or mania and depression are both present during this mixed-mood episode. °SIGNS AND SYMPTOMS °There are signs and symptoms of the episodes of hypomania and mania as well as the episodes of depression. The signs and symptoms of hypomania and mania are similar but vary in severity. They include: °· Inflated self-esteem or feeling of increased self-confidence. °· Decreased need for sleep. °· Unusual talkativeness (rapid or pressured speech) or the feeling of a need to keep talking. °· Sensation of racing thoughts or constant talking, with quick shifts between topics that may or may not be related (flight of ideas). °· Decreased ability to focus or concentrate. °· Increased purposeful activity, such as work, studies,  or social activity, or nonproductive activity, such as pacing, squirming and fidgeting, or finger and toe tapping. °· Impulsive behavior and use of poor judgment, resulting in high-risk activities, such as having unprotected sex or spending excessive amounts of money. °Signs and symptoms of depression include the following:  °· Feelings of sadness, hopelessness, or helplessness. °· Frequent or uncontrollable episodes of crying. °· Lack of feeling anything or caring about anything. °· Difficulty sleeping or sleeping too much.  °· Inability to enjoy the things you used to enjoy.   °· Desire to be alone all the time.   °· Feelings of guilt or worthlessness.  °· Lack of energy or motivation.   °· Difficulty concentrating, remembering, or making decisions.  °· Change in appetite or weight beyond normal fluctuations. °· Thoughts of death or the desire to harm yourself. °DIAGNOSIS  °Bipolar disorder is diagnosed through an assessment by your caregiver. Your caregiver will ask questions about your emotional episodes. There are two main types of bipolar disorder. People with type I bipolar disorder have manic episodes with or without depressive episodes. People with type II bipolar disorder have hypomanic episodes and major depressive episodes, which are more serious than mild depression. The type of bipolar disorder you have can make an important difference in how your illness is monitored and treated. °Your caregiver may ask questions about your medical history and use of alcohol or drugs, including prescription medication. Certain medical conditions and substances also can cause emotional highs and lows that resemble bipolar disorder (secondary bipolar disorder).  °TREATMENT  °Bipolar disorder is a long-term illness. It is best controlled with continuous treatment rather than treatment only when symptoms occur. The following treatments can be prescribed for bipolar disorders: °· Medication Medication can be prescribed by  a doctor   that is an expert in treating mental disorders (psychiatrists). Medications called mood stabilizers are usually prescribed to help control the illness. Other medications are sometimes added if symptoms of mania, depression, or psychotic delusions and hallucinations occur despite the use of a mood stabilizer. °· Talk therapy Some forms of talk therapy are helpful in providing support, education, and guidance. °A combination of medication and talk therapy is best for managing the disorder over time. A procedure in which electricity is applied to your brain through your scalp (electroconvulsive therapy) is used in cases of severe mania when medication and talk therapy do not work or work too slowly. °Document Released: 01/20/2001 Document Revised: 02/08/2013 Document Reviewed: 11/09/2012 °ExitCare® Patient Information ©2014 [employer]. ° °

## 2013-12-22 NOTE — Progress Notes (Signed)
Subjective:    Patient ID: Julia Anderson, female    DOB: 05/13/1986, 28 y.o.   MRN: [id number]  HPI  28 year old white female, nonsmoker, patient of Dr. Clent RidgesFry is in today with concerns of anxiety, depression, nervousness, filling self-conscious, inability to focus, mood swings, panic attacks over the last one month. Reports that she stopped her birth control 1 month ago and has recently resumed taking it. She now has some breakthrough bleeding. Concerned that the hormonal changes causing her mood to be unstable. She has a history of anxiety and has taken and failed Lexapro and citalopram in the past.   Review of Systems  Constitutional: Negative.   Respiratory: Negative.   Cardiovascular: Negative.   Gastrointestinal: Negative.   Genitourinary: Positive for vaginal bleeding. Negative for vaginal discharge and vaginal pain.  Musculoskeletal: Negative.   Skin: Negative.   Allergic/Immunologic: Negative.   Neurological: Negative.   Hematological: Negative.   Psychiatric/Behavioral: Positive for agitation. The patient is hyperactive.    Past Medical History  Diagnosis Date  . Allergy   . Asthma   . Cerebral palsy   . Anxiety     History   Social History  . Marital Status: Single    Spouse Name: N/A    Number of Children: N/A  . Years of Education: N/A   Occupational History  . cosmetic advisor    Social History Main Topics  . Smoking status: Never Smoker   . Smokeless tobacco: Never Used  . Alcohol Use: Yes     Comment: rare  . Drug Use: No  . Sexual Activity: Yes    Partners: Male   Other Topics Concern  . Not on file   Social History Narrative  . No narrative on file    Past Surgical History  Procedure Laterality Date  . Foot capsule release w/ percutaneous heel cord lengthening, tibial tendon transfer      lengthen a tight heel cord left leg    Family History  Problem Relation Age of Onset  . Suicidality Mother   . Cirrhosis Father   . Cancer  Paternal Aunt     breast  . Diabetes Paternal Aunt   . Diabetes Paternal Grandfather   . Cancer Paternal Grandfather     prostate    Allergies  Allergen Reactions  . Iohexol      Desc: Pt had reaction to IV cm of itching and breathing heavy at [facility] back in 2006. Given benadryl at the time by Mom.     Current Outpatient Prescriptions on File Prior to Visit  Medication Sig Dispense Refill  . HYDROcodone-acetaminophen (NORCO/VICODIN) 5-325 MG per tablet Take 1 tablet by mouth every 4 (four) hours as needed for pain.  30 tablet  2  . LORazepam (ATIVAN) 0.5 MG tablet Take 1 tablet (0.5 mg total) by mouth 2 (two) times daily as needed for anxiety.  60 tablet  2  . Multiple Vitamin (MULTIVITAMIN) tablet Take 1 tablet by mouth daily.      . SUMAtriptan (IMITREX) 100 MG tablet Take 1 tablet (100 mg total) by mouth as needed for migraine.  10 tablet  11  . topiramate (TOPAMAX) 25 MG tablet Take 1 tablet (25 mg total) by mouth daily.  30 tablet  5  . VESTURA 3-0.02 MG tablet TAKE 1 TABLET BY MOUTH EVERY DAY  28 tablet  11   No current facility-administered medications on file prior to visit.    BP 120/78  Pulse 69  Temp(Src) 98.3 F (36.8 C) (Oral)  Wt 179 lb (81.194 kg)chart    Objective:   Physical Exam  Constitutional: She is oriented to person, place, and time. She appears well-developed and well-nourished.  HENT:  Right Ear: External ear normal.  Left Ear: External ear normal.  Nose: Nose normal.  Mouth/Throat: Oropharynx is clear and moist.  Neck: Normal range of motion. Neck supple.  Cardiovascular: Normal rate, regular rhythm and normal heart sounds.   Pulmonary/Chest: Effort normal and breath sounds normal.  Abdominal: Soft. Bowel sounds are normal.  Musculoskeletal: Normal range of motion.  Neurological: She is alert and oriented to person, place, and time.  Skin: Skin is warm and dry.  Psychiatric: Her mood appears anxious. She expresses impulsivity. She exhibits a  depressed mood. She expresses no homicidal and no suicidal ideation. She expresses no suicidal plans and no homicidal plans.          Assessment & Plan:  [name] was seen today for anxiety.  Diagnoses and associated orders for this visit:  Anxiety state, unspecified - TSH  Mood swings - TSH  Other Orders - buPROPion (WELLBUTRIN XL) 150 MG 24 hr tablet; Take 1 tablet (150 mg total) by mouth daily.    With Dr. Clent Ridges about the possibility of bipolar disorder and patient potentially needing a mood stabilizer.  this I will start Wellbutrin 150 mg once daily. In 1 week she will come back and see Dr. Clent Ridges and considered adding Lamictal.

## 2014-04-27 ENCOUNTER — Telehealth: Payer: Self-pay | Admitting: Neurology

## 2014-04-27 ENCOUNTER — Encounter: Payer: Self-pay | Admitting: Neurology

## 2014-04-27 ENCOUNTER — Ambulatory Visit (INDEPENDENT_AMBULATORY_CARE_PROVIDER_SITE_OTHER): Payer: 59 | Admitting: Neurology

## 2014-04-27 VITALS — BP 110/73 | HR 65 | Ht 70.0 in | Wt 177.0 lb

## 2014-04-27 DIAGNOSIS — G809 Cerebral palsy, unspecified: Secondary | ICD-10-CM

## 2014-04-27 DIAGNOSIS — G43909 Migraine, unspecified, not intractable, without status migrainosus: Secondary | ICD-10-CM | POA: Insufficient documentation

## 2014-04-27 DIAGNOSIS — G43009 Migraine without aura, not intractable, without status migrainosus: Secondary | ICD-10-CM

## 2014-04-27 MED ORDER — RIZATRIPTAN BENZOATE 5 MG PO TBDP: 5.0000 mg | ORAL_TABLET | ORAL | Status: DC | PRN

## 2014-04-27 MED ORDER — TOPIRAMATE 100 MG PO TABS: ORAL_TABLET | ORAL | Status: DC

## 2014-04-27 NOTE — Progress Notes (Signed)
PATIENT: Julia Anderson DOB: 01/18/1986  HISTORICAL  Julia Anderson is 28 years old right-handed Caucasian female, referred by her primary care physician Julia Anderson for evaluation of headaches  She had past medical history of cerebral palsy, with gait difficulty, chronic migraine headaches,  She noticed almost constant left temporal, frontal area pressure sensation since age 28, daily constant, also had a history of migraine headaches, become much more frequent since November 2014, couple times a week, the pressure on the left side would exacerbate to a painful pounding headaches, with associated light noise sensitivity, sometimes nauseous, vomiting  She was recently getting a sample of Imitrex, which has worked well for her, which take away her headache within 30 minutes, otherwise, her headache can linger around for one day.     REVIEW OF SYSTEMS: Full 14 system review of systems performed and notable only for weight gain, fatigue, blurred vision, eye pain, shortness of breath, constipation, easy bruising, feeling hot, headache, anxiety, decreased energy  ALLERGIES: Allergies  Allergen Reactions  . Iohexol      Desc: Pt had reaction to IV cm of itching and breathing heavy at [facility] back in 2006. Given benadryl at the time by Mom.     HOME MEDICATIONS: Current Outpatient Prescriptions on File Prior to Visit  Medication Sig Dispense Refill  . Multiple Vitamin (MULTIVITAMIN) tablet Take 1 tablet by mouth daily.      . SUMAtriptan (IMITREX) 100 MG tablet Take 1 tablet (100 mg total) by mouth as needed for migraine.  10 tablet  11  . VESTURA 3-0.02 MG tablet TAKE 1 TABLET BY MOUTH EVERY DAY  28 tablet  11     PAST MEDICAL HISTORY: Past Medical History  Diagnosis Date  . Allergy   . Asthma   . Cerebral palsy   . Anxiety   . Migraine     PAST SURGICAL HISTORY: Past Surgical History  Procedure Laterality Date  . Foot capsule release w/ percutaneous heel cord  lengthening, tibial tendon transfer      lengthen a tight heel cord left leg    FAMILY HISTORY: Family History  Problem Relation Age of Onset  . Suicidality Mother   . Cirrhosis Father   . Cancer Paternal Aunt     breast  . Diabetes Paternal Aunt   . Diabetes Paternal Grandfather   . Cancer Paternal Grandfather     prostate  . Alcohol abuse Mother   . Drug abuse Mother   . Depression Mother   . Anxiety disorder Mother     SOCIAL HISTORY:  History   Social History  . Marital Status: Single    Spouse Name: N/A    Number of Children: 0  . Years of Education: N/A   Occupational History  . cosmetic advisor    Social History Main Topics  . Smoking status: Never Smoker   . Smokeless tobacco: Never Used  . Alcohol Use: 0.6 oz/week    1 Glasses of wine per week     Comment: rare  . Drug Use: No  . Sexual Activity: Yes    Partners: Male   Other Topics Concern  . Not on file   Social History Narrative   Patient lives at home with her grandparents. Patient works full time at [employer]      Education some college.   Right handed.   Caffeine two cups of coffee daily and one mountain dew daily.    PHYSICAL EXAM  Filed Vitals:   04/27/14 1028  BP: 110/73  Pulse: 65  Height: 5\' 10"  (1.778 m)  Weight: 177 lb (80.287 kg)   Body mass index is 25.4 kg/(m^2).   Generalized: In no acute distress  Neck: Supple, no carotid bruits   Cardiac: Regular rate rhythm  Pulmonary: Clear to auscultation bilaterally  Neurological examination  Mentation: Alert oriented to time, place, history taking, and causual conversation  Cranial nerve II-XII: Pupils were equal round reactive to light. Extraocular movements were full.  Visual field were full on confrontational test. Bilateral fundi were sharp.  Facial sensation and strength were normal. Hearing was intact to finger rubbing bilaterally. Uvula tongue midline.  Head turning and shoulder shrug and were normal and  symmetric.Tongue protrusion into cheek strength was normal.  Motor: She has limited range of motion of her bilateral ankles, especially left toes, mild to moderate spasticity of bilateral lower extremity  Sensory: Intact to fine touch, pinprick, preserved vibratory sensation, and proprioception at toes.  Coordination: Normal finger to nose, heel-to-shin bilaterally there was no truncal ataxia  Gait: She tends to walk with tiptoes, bilateral valgrus knee, stiff gait  Deep tendon reflexes: Brachioradialis 2/2, biceps 2/2, triceps 2/2, patellar 2/2, Achilles 2/2, plantar responses were flexor bilaterally.   DIAGNOSTIC DATA (LABS, IMAGING, TESTING) - I reviewed patient records, labs, notes, testing and imaging myself where available.  Lab Results  Component Value Date   WBC 4.3* 01/20/2013   HGB 13.7 01/20/2013   HCT 40.2 01/20/2013   MCV 96.0 01/20/2013   PLT 206.0 01/20/2013      Component Value Date/Time   NA 140 01/20/2013 0954   K 4.9 01/20/2013 0954   CL 105 01/20/2013 0954   CO2 27 01/20/2013 0954   GLUCOSE 84 01/20/2013 0954   BUN 14 01/20/2013 0954   CREATININE 0.7 01/20/2013 0954   CALCIUM 9.5 01/20/2013 0954   PROT 7.7 01/20/2013 0954   ALBUMIN 4.1 01/20/2013 0954   AST 20 01/20/2013 0954   ALT 17 01/20/2013 0954   ALKPHOS 55 01/20/2013 0954   BILITOT 0.5 01/20/2013 0954   GFRNONAA 112.07 11/09/2009 1228   GFRAA 164 11/25/2008 1039   Lab Results  Component Value Date   CHOL 160 01/20/2013   HDL 62.30 01/20/2013   LDLCALC 89 01/20/2013   TRIG 44.0 01/20/2013   CHOLHDL 3 01/20/2013   Lab Results  Component Value Date   TSH 1.80 12/22/2013    ASSESSMENT AND PLAN  Julia Anderson is a 28 y.o. female with history of cerebral palsy, complains of persistent left-sided pressure, presenting with increased frequency of headaches, with migraine features,  1. We will proceed evaluation with MRI of the brain, EEG, 2. Topamax 100 mg twice a day as migraine prevention 3 Maxalt as needed  for abortive treatment 4 return to clinic in 2 months with [name].   Haddon Fyfe, M.D. Ph.D.  [facility] [address], [address] [place], [place] [address] [phone]

## 2014-04-27 NOTE — Telephone Encounter (Signed)
Patient will call back later to schedule EEG, needs to look at her schedule first.

## 2014-05-05 ENCOUNTER — Ambulatory Visit (INDEPENDENT_AMBULATORY_CARE_PROVIDER_SITE_OTHER): Payer: 59 | Admitting: Radiology

## 2014-05-05 ENCOUNTER — Ambulatory Visit (INDEPENDENT_AMBULATORY_CARE_PROVIDER_SITE_OTHER): Payer: 59

## 2014-05-05 DIAGNOSIS — G809 Cerebral palsy, unspecified: Secondary | ICD-10-CM

## 2014-05-05 DIAGNOSIS — G43009 Migraine without aura, not intractable, without status migrainosus: Secondary | ICD-10-CM

## 2014-05-06 ENCOUNTER — Telehealth: Payer: Self-pay | Admitting: Neurology

## 2014-05-06 NOTE — Telephone Encounter (Signed)
I have called her MRI brain, which showed mild abnormalities.  The brain parenchyma shows multiple posterior periventricular, periatrial and subcortical bilateral white matter hyperintensities which are nonspecific but may be seen with demyelinating disease though vasculitis, small vessel disease, autoimmune or infectious conditions are also possible. The cortical sulci, fissures and cisterns are normal in size and appearance. Lateral, third and fourth ventricle are normal in size and appearance. No extra-axial fluid collections are seen.   Julia HarmanDana, please give her a follow up appt, in my next available to review MRI

## 2014-05-06 NOTE — Procedures (Signed)
   HISTORY: 28 years old female, with history of cerebral palsy, presenting with chronic migraines, headaches, complains of constant pressure at the left side,  TECHNIQUE:  16 channel EEG was performed based on standard 10-16 international system. One channel was dedicated to EKG, which has demonstrates normal sinus rhythm of 66 beats per minutes.  Upon awakening, the posterior background activity was well-developed, in alpha range, 10 Hz, reactive to eye opening and closure.  There was evidence of sudden protrusion of generalized slowing, spike slow waves, lasting less than 1-2 seconds  Photic stimulation was performed, which induced a symmetric photic driving.  Hyperventilation was not performed due to a history of asthma.  Stage II sleep was achieved as evidenced by K complex, sleep spindles  CONCLUSION: This is a mild abnormal EEG. There was evidence of short protrusion of generalized slowing waves, indicating bicerebral hemisphere malfunction, common etiology are metabolic toxic.

## 2014-05-10 NOTE — Telephone Encounter (Signed)
Patient is coming to see Dr.Yan for Follow up 05/11/2014 to go over MRI Results.

## 2014-05-11 ENCOUNTER — Encounter: Payer: Self-pay | Admitting: Neurology

## 2014-05-11 ENCOUNTER — Telehealth: Payer: Self-pay

## 2014-05-11 ENCOUNTER — Ambulatory Visit (INDEPENDENT_AMBULATORY_CARE_PROVIDER_SITE_OTHER): Payer: 59 | Admitting: Neurology

## 2014-05-11 VITALS — BP 122/81 | HR 76 | Ht 71.0 in | Wt 175.0 lb

## 2014-05-11 DIAGNOSIS — R42 Dizziness and giddiness: Secondary | ICD-10-CM

## 2014-05-11 DIAGNOSIS — F329 Major depressive disorder, single episode, unspecified: Secondary | ICD-10-CM

## 2014-05-11 DIAGNOSIS — F3289 Other specified depressive episodes: Secondary | ICD-10-CM

## 2014-05-11 DIAGNOSIS — G44229 Chronic tension-type headache, not intractable: Secondary | ICD-10-CM

## 2014-05-11 DIAGNOSIS — G809 Cerebral palsy, unspecified: Secondary | ICD-10-CM

## 2014-05-11 DIAGNOSIS — G43009 Migraine without aura, not intractable, without status migrainosus: Secondary | ICD-10-CM

## 2014-05-11 MED ORDER — TOPIRAMATE 100 MG PO TABS: ORAL_TABLET | ORAL | Status: DC

## 2014-05-11 NOTE — Telephone Encounter (Signed)
I called [facility], they said they would charge the patient $200 per month for this Rx.  I looked online at goodrx.com and saw that [facility] offers a 90 day supply of this med for $24.  Called the patient back.  She is agreeable to getting Rx at [facility] on [address].  Says she will go tomorrow to pick iy up and call us back if anything further is needed.

## 2014-05-11 NOTE — Telephone Encounter (Signed)
Message copied by Malachy MoanBANKS, Kaliq Lege C on Wed May 11, 2014 12:48 PM ------      Message from: Levert FeinsteinYAN, YIJUN      Created: Wed May 11, 2014 11:38 AM       Please help her to get TOPAMAX 100mg  bid, she could not afford it. ------

## 2014-05-11 NOTE — Progress Notes (Signed)
PATIENT: Julia Anderson DOB: 11/17/1985  HISTORICAL (Initial visit July 1st 2015)  Julia Anderson is 28 years old right-handed Caucasian female, referred by her primary care physician PA Amy Boucherle for evaluation of headaches  She had past medical history of cerebral palsy, with gait difficulty, chronic migraine headaches,  She noticed almost constant left temporal, frontal area pressure sensation since age 28, daily constant, also had a history of migraine headaches, become much more frequent since November 2014, couple times a week, the pressure on the left side would exacerbate to a painful pounding headaches, with associated light noise sensitivity, sometimes nauseous, vomiting  She was recently getting a sample of Imitrex, which has worked well for her, which take away her headache within 30 minutes, otherwise, her headache can linger around for one day.    UPDATE July 15th 2015:  Julia Anderson is with her aunt at today's clinical visit.  She was not able to afford Topamax, she is to take 2 or 3 times ibuprofen, Tylenol, or Imitrex sample each week because of her headaches  She has abnormal EEG. There was evidence of short protrusion of generalized slowing waves, indicating bicerebral hemisphere malfunction  She reported episode of zoom out, especially when she drives, she could not focus, then come back, there was also episodes of difficulty reading, aunt reported that she has episodes of that she snap out of   MRI of the brain showed multiple posterior periventricular, periatrial and subcortical bilateral white matter hyperintensities which are nonspecific but may be seen with demyelinating disease though vasculitis, small vessel disease, autoimmune or infectious conditions are also possible    REVIEW OF SYSTEMS: Full 14 system review of systems performed and notable only for fatigue, blurred vision, swollen abdomen, constipation, frequent awakening, frequent urination, neck  pain, dizziness, headaches, agitation, anxiety  ALLERGIES: Allergies  Allergen Reactions  . Iohexol      Desc: Pt had reaction to IV cm of itching and breathing heavy at [facility] back in 2006. Given benadryl at the time by Mom.     HOME MEDICATIONS: Current Outpatient Prescriptions on File Prior to Visit  Medication Sig Dispense Refill  . Multiple Vitamin (MULTIVITAMIN) tablet Take 1 tablet by mouth daily.      . SUMAtriptan (IMITREX) 100 MG tablet Take 1 tablet (100 mg total) by mouth as needed for migraine.  10 tablet  11  . VESTURA 3-0.02 MG tablet TAKE 1 TABLET BY MOUTH EVERY DAY  28 tablet  11     PAST MEDICAL HISTORY: Past Medical History  Diagnosis Date  . Allergy   . Asthma   . Cerebral palsy   . Anxiety   . Migraine     PAST SURGICAL HISTORY: Past Surgical History  Procedure Laterality Date  . Foot capsule release w/ percutaneous heel cord lengthening, tibial tendon transfer      lengthen a tight heel cord left leg    FAMILY HISTORY: Family History  Problem Relation Age of Onset  . Suicidality Mother   . Cirrhosis Father   . Cancer Paternal Aunt     breast  . Diabetes Paternal Aunt   . Diabetes Paternal Grandfather   . Cancer Paternal Grandfather     prostate  . Alcohol abuse Mother   . Drug abuse Mother   . Depression Mother   . Anxiety disorder Mother     SOCIAL HISTORY:  History   Social History  . Marital Status: Single    Spouse Name:  N/A    Number of Children: 0  . Years of Education: N/A   Occupational History  . cosmetic advisor    Social History Main Topics  . Smoking status: Never Smoker   . Smokeless tobacco: Never Used  . Alcohol Use: 0.6 oz/week    1 Glasses of wine per week     Comment: rare  . Drug Use: No  . Sexual Activity: Yes    Partners: Male   Other Topics Concern  . Not on file   Social History Narrative   Patient lives at home with her grandparents. Patient works full time at [employer] some  college.   Right handed.   Caffeine two cups of coffee daily and one mountain dew daily.    PHYSICAL EXAM   There were no vitals filed for this visit. There is no weight on file to calculate BMI.   Generalized: In no acute distress  Neck: Supple, no carotid bruits   Cardiac: Regular rate rhythm  Pulmonary: Clear to auscultation bilaterally  Neurological examination  Mentation: Alert oriented to time, place, history taking, and causual conversation  Cranial nerve II-XII: Pupils were equal round reactive to light. She has bilateral exotropia, with limitation at bilateral adduction.  Visual field were full on confrontational test. Bilateral fundi were sharp.  Facial sensation and strength were normal. Hearing was intact to finger rubbing bilaterally. Uvula tongue midline.  Head turning and shoulder shrug and were normal and symmetric.Tongue protrusion into cheek strength was normal.  Motor: She has limited range of motion of her bilateral ankles, especially left toes, mild to moderate spasticity of bilateral lower extremity  Sensory: Intact to fine touch, pinprick, preserved vibratory sensation, and proprioception at toes.  Coordination: Normal finger to nose, heel-to-shin bilaterally there was no truncal ataxia  Gait: She tends to walk with tiptoes, bilateral valgrus knee, stiff gait  Deep tendon reflexes: Brachioradialis 2/2, biceps 2/2, triceps 2/2, patellar 2/2, Achilles 2/2, plantar responses were flexor bilaterally.   DIAGNOSTIC DATA (LABS, IMAGING, TESTING) - I reviewed patient records, labs, notes, testing and imaging myself where available.  Lab Results  Component Value Date   WBC 4.3* 01/20/2013   HGB 13.7 01/20/2013   HCT 40.2 01/20/2013   MCV 96.0 01/20/2013   PLT 206.0 01/20/2013      Component Value Date/Time   NA 140 01/20/2013 0954   K 4.9 01/20/2013 0954   CL 105 01/20/2013 0954   CO2 27 01/20/2013 0954   GLUCOSE 84 01/20/2013 0954   BUN 14 01/20/2013 0954    CREATININE 0.7 01/20/2013 0954   CALCIUM 9.5 01/20/2013 0954   PROT 7.7 01/20/2013 0954   ALBUMIN 4.1 01/20/2013 0954   AST 20 01/20/2013 0954   ALT 17 01/20/2013 0954   ALKPHOS 55 01/20/2013 0954   BILITOT 0.5 01/20/2013 0954   GFRNONAA 112.07 11/09/2009 1228   GFRAA 164 11/25/2008 1039   Lab Results  Component Value Date   CHOL 160 01/20/2013   HDL 62.30 01/20/2013   LDLCALC 89 01/20/2013   TRIG 44.0 01/20/2013   CHOLHDL 3 01/20/2013   Lab Results  Component Value Date   TSH 1.80 12/22/2013    ASSESSMENT AND PLAN  ARIONNA HOGGARD is a 28 y.o. female with history of cerebral palsy, complains of persistent left-sided pressure, presenting with increased frequency of headaches, with migraine features, we have reviewed MRI of brain together, The brain parenchyma shows multiple posterior periventricular, periatrial and subcortical  bilateral white matter hyperintensities which are nonspecific but may be seen with demyelinating disease though vasculitis, small vessel disease, autoimmune or infectious conditions are also possible  EEG showed protrusion of transient slow waves,  She could not afford Topamax, I will ask my office pharmacy tech to try to get medical assistance program Imitrex as needed She reported transient loss of focus, while driving, could snap out of it. DDx includes partial seizure vs mood disorder anxiety related symptoms. No driving till after next follow up visit in one month. 24 hours video-eeg monitoring.   Levert Feinstein, M.D. Ph.D.  [facility] [address] [place], [place] [address] [phone]

## 2014-05-11 NOTE — Patient Instructions (Signed)
Magnesium oxide 400 mg twice a day, riboflavin 100 mg twice a day.  No driving till coming back to clinic in one month.  Repeat EEG sleep deprived

## 2014-05-17 ENCOUNTER — Telehealth: Payer: Self-pay | Admitting: Neurology

## 2014-05-17 ENCOUNTER — Ambulatory Visit (INDEPENDENT_AMBULATORY_CARE_PROVIDER_SITE_OTHER): Payer: 59

## 2014-05-17 DIAGNOSIS — G808 Other cerebral palsy: Secondary | ICD-10-CM

## 2014-05-17 DIAGNOSIS — R51 Headache: Secondary | ICD-10-CM

## 2014-05-17 NOTE — Telephone Encounter (Signed)
I called patient. The EEG study once again shows bursts of slowing that may have some predominance of the left hemisphere. Is not clear what the significance of these episodes represent, could be an interictal pattern, but the patient does not have a clear history of seizure-type events. She does have an abnormal MRI the brain. I will send these results to Dr. Terrace ArabiaYan.

## 2014-05-17 NOTE — Procedures (Signed)
     Julia RutterRebecca Anderson is a 28 year old patient with a history of cerebral palsy and a history of left-sided headaches associated with a pressure sensation. MRI the brain has shown multiple white matter abnormalities, particularly posteriorly. The patient is being evaluated for the above issues.  This is a routine EEG. No skull defects are noted. Medications include biotin, multivitamins, Maxalt, Imitrex, and Vestura.  EEG classification: Dysrhythmia grade 1 generalized  Description of the recording: The background rhythms of this recording consists of a fairly well modulated medium amplitude alpha rhythm of 9 Hz that is reactive to eye opening closure. As the record progresses, photic stimulation is performed, and this results in a bilateral and symmetric photic driving response. Hyperventilation is performed, resulting in a minimal buildup of the background rhythm activities with slight background slowing seen. Intermittently during the recording, there appear to be brief bursts of theta frequency activity in the 6 Hz range lasting about one second. This is seen off and on throughout the recording, and is generalized in nature, but there appears to be some occasional predominance in the left temporal region. At no time during the recording does there appear to be evidence of spike or spike wave discharges. EKG monitor shows no evidence of cardiac rhythm abnormalities with a heart rate of 72.  Impression: This is an abnormal EEG recording secondary to episodic bursts of theta slowing in a generalized fashion, with occasional predominance in the left temporal region. This is a nonspecific recording, and may be related to pathology in the deep white matter or midline nuclei. No clear epileptiform discharges are seen, but this recording could represent an interictal pattern. Clinical correlation is required.

## 2014-05-31 DIAGNOSIS — G809 Cerebral palsy, unspecified: Secondary | ICD-10-CM

## 2014-05-31 DIAGNOSIS — G43009 Migraine without aura, not intractable, without status migrainosus: Secondary | ICD-10-CM

## 2014-06-08 ENCOUNTER — Telehealth: Payer: Self-pay | Admitting: *Deleted

## 2014-06-08 NOTE — Telephone Encounter (Signed)
Calling patient on home number [phone] states that number is invalid, so I called patient grandfather Julia Anderson, left message with him to have patient call back and r/s appointment time on 06/30/14 with NP MM. NP CM has never seen patient before.

## 2014-06-16 ENCOUNTER — Encounter: Payer: Self-pay | Admitting: Neurology

## 2014-06-16 ENCOUNTER — Ambulatory Visit (INDEPENDENT_AMBULATORY_CARE_PROVIDER_SITE_OTHER): Payer: 59 | Admitting: Neurology

## 2014-06-16 VITALS — BP 111/72 | HR 61 | Ht 67.5 in | Wt 178.0 lb

## 2014-06-16 DIAGNOSIS — G43011 Migraine without aura, intractable, with status migrainosus: Secondary | ICD-10-CM

## 2014-06-16 DIAGNOSIS — G809 Cerebral palsy, unspecified: Secondary | ICD-10-CM

## 2014-06-16 DIAGNOSIS — G44229 Chronic tension-type headache, not intractable: Secondary | ICD-10-CM

## 2014-06-16 NOTE — Progress Notes (Signed)
PATIENT: Julia PianoRebecca L Anderson DOB: 02/01/1986  HISTORICAL (Initial visit July 1st 2015)  Julia PianoRebecca L Anderson is 28 years old right-handed Caucasian female, referred by her primary care physician PA Amy Boucherle for evaluation of headaches  She had past medical history of cerebral palsy, with gait difficulty, chronic migraine headaches,  She noticed almost constant left temporal, frontal area pressure sensation since age 89, daily constant, also had a history of migraine headaches, become much more frequent since November 2014, couple times a week, the pressure on the left side would exacerbate to a painful pounding headaches, with associated light noise sensitivity, sometimes nauseous, vomiting  She was recently getting a sample of Imitrex, which has worked well for her, which take away her headache within 30 minutes, otherwise, her headache can linger around for one day.    UPDATE July 15th 2015:  Julia JoinerRebecca is with her aunt at today's clinical visit.  She was not able to afford Topamax, she is to take 2 or 3 times ibuprofen, Tylenol, or Imitrex sample each week because of her headaches  She has abnormal EEG. There was evidence of short protrusion of generalized slowing waves, indicating bicerebral hemisphere malfunction  She reported episode of zoom out, especially when she drives, she could not focus, then come back, there was also episodes of difficulty reading, aunt reported that she has episodes of that she snap out of   I have reviewed MRI of the brain July 2015 at [facility], showed multiple posterior periventricular, periatrial and subcortical bilateral white matter hyperintensities which are nonspecific, most likely small vessel disease, but radiologist also raise the possibility of demyelinating disease though vasculitis, small vessel disease, autoimmune or infectious conditions are also possible  UPDATE August 20th 2015:  She is taking Topamax 100mg  bid, she paid #28 for 3  months supply, she does not have headaches much. No zooming out episodes. She is complaining alternating of the taste, some weight loss,  She is no longer staying with her aunt, she is staying with her grandparents now, she is a Careers information officerpharmarcy tech.  Repeat EEG showed generalized slowing, she had Video-EEG monitoring around August 4th, report still pending.    REVIEW OF SYSTEMS: Full 14 system review of systems performed and notable only for frequent wakening, daytime sleepiness  ALLERGIES: Allergies  Allergen Reactions  . Iohexol      Desc: Pt had reaction to IV cm of itching and breathing heavy at [facility] back in 2006. Given benadryl at the time by Mom.     HOME MEDICATIONS: Current Outpatient Prescriptions on File Prior to Visit  Medication Sig Dispense Refill  . Multiple Vitamin (MULTIVITAMIN) tablet Take 1 tablet by mouth daily.      . SUMAtriptan (IMITREX) 100 MG tablet Take 1 tablet (100 mg total) by mouth as needed for migraine.  10 tablet  11  . VESTURA 3-0.02 MG tablet TAKE 1 TABLET BY MOUTH EVERY DAY  28 tablet  11     PAST MEDICAL HISTORY: Past Medical History  Diagnosis Date  . Allergy   . Asthma   . Cerebral palsy   . Anxiety   . Migraine     PAST SURGICAL HISTORY: Past Surgical History  Procedure Laterality Date  . Foot capsule release w/ percutaneous heel cord lengthening, tibial tendon transfer      lengthen a tight heel cord left leg    FAMILY HISTORY: Family History  Problem Relation Age of Onset  . Suicidality Mother   .  Cirrhosis Father   . Cancer Paternal Aunt     breast  . Diabetes Paternal Aunt   . Diabetes Paternal Grandfather   . Cancer Paternal Grandfather     prostate  . Alcohol abuse Mother   . Drug abuse Mother   . Depression Mother   . Anxiety disorder Mother     SOCIAL HISTORY:  History   Social History  . Marital Status: Single    Spouse Name: N/A    Number of Children: 0  . Years of Education: N/A   Occupational History    . cosmetic advisor    Social History Main Topics  . Smoking status: Never Smoker   . Smokeless tobacco: Never Used  . Alcohol Use: 0.6 oz/week    1 Glasses of wine per week     Comment: rare  . Drug Use: No  . Sexual Activity: Yes    Partners: Male   Other Topics Concern  . Not on file   Social History Narrative   Patient lives at home with her grandparents. Patient works full time at [employer] some college.   Right handed.   Caffeine one cup of coffee daily .    PHYSICAL EXAM   Filed Vitals:   06/16/14 0816  BP: 111/72  Pulse: 61  Height: 5' 7.5" (1.715 m)  Weight: 178 lb (80.74 kg)   Body mass index is 27.45 kg/(m^2).   Generalized: In no acute distress  Neck: Supple, no carotid bruits   Cardiac: Regular rate rhythm  Pulmonary: Clear to auscultation bilaterally  Neurological examination  Mentation: Alert oriented to time, place, history taking, and causual conversation  Cranial nerve II-XII: Pupils were equal round reactive to light. She has bilateral exotropia, with limitation at bilateral adduction.  Visual field were full on confrontational test. Bilateral fundi were sharp.  Facial sensation and strength were normal. Hearing was intact to finger rubbing bilaterally. Uvula tongue midline.  Head turning and shoulder shrug and were normal and symmetric.Tongue protrusion into cheek strength was normal.  Motor: She has limited range of motion of her bilateral ankles, especially left toes, mild to moderate spasticity of bilateral lower extremity  Sensory: Intact to fine touch, pinprick, preserved vibratory sensation, and proprioception at toes.  Coordination: Normal finger to nose, heel-to-shin bilaterally there was no truncal ataxia  Gait: She tends to walk with tiptoes, bilateral valgrus knee, stiff gait  Deep tendon reflexes: Brachioradialis 2/2, biceps 2/2, triceps 2/2, patellar 2/2, Achilles 2/2, plantar responses were flexor  bilaterally.   DIAGNOSTIC DATA (LABS, IMAGING, TESTING) - I reviewed patient records, labs, notes, testing and imaging myself where available.  Lab Results  Component Value Date   WBC 4.3* 01/20/2013   HGB 13.7 01/20/2013   HCT 40.2 01/20/2013   MCV 96.0 01/20/2013   PLT 206.0 01/20/2013      Component Value Date/Time   NA 140 01/20/2013 0954   K 4.9 01/20/2013 0954   CL 105 01/20/2013 0954   CO2 27 01/20/2013 0954   GLUCOSE 84 01/20/2013 0954   BUN 14 01/20/2013 0954   CREATININE 0.7 01/20/2013 0954   CALCIUM 9.5 01/20/2013 0954   PROT 7.7 01/20/2013 0954   ALBUMIN 4.1 01/20/2013 0954   AST 20 01/20/2013 0954   ALT 17 01/20/2013 0954   ALKPHOS 55 01/20/2013 0954   BILITOT 0.5 01/20/2013 0954   GFRNONAA 112.07 11/09/2009 1228   GFRAA 164 11/25/2008 1039   Lab Results  Component  Value Date   CHOL 160 01/20/2013   HDL 62.30 01/20/2013   LDLCALC 89 01/20/2013   TRIG 44.0 01/20/2013   CHOLHDL 3 01/20/2013   Lab Results  Component Value Date   TSH 1.80 12/22/2013    ASSESSMENT AND PLAN  SHAWNTEL FARNWORTH is a 28 y.o. female with history of cerebral palsy, complains of persistent left-sided pressure, presenting with increased frequency of headaches, with migraine features, we have reviewed MRI of brain together, The brain parenchyma shows multiple posterior periventricular, periatrial and subcortical bilateral white matter hyperintensities which are nonspecific but may be seen with demyelinating disease though vasculitis, small vessel disease, autoimmune or infectious conditions are also possible  EEG showed protrusion of transient slow waves,24 hours video-eeg monitoring was done report pending. She tolerate  Topamax 100mg  bid well, has less headaches, and less zooming out spells. Imitrex/maxalt as needed Continue current medications, return to clinic in 6 months with Julia Anderson, we will consider repeat MRI of the brain in one year    Levert Feinstein, M.D. Ph.D.  [facility] [address], [address] [place], [place] [address] [phone]

## 2014-07-01 ENCOUNTER — Ambulatory Visit: Payer: 59 | Admitting: Nurse Practitioner

## 2014-08-29 ENCOUNTER — Encounter: Payer: Self-pay | Admitting: Neurology

## 2014-09-08 ENCOUNTER — Emergency Department (HOSPITAL_COMMUNITY)
Admission: EM | Admit: 2014-09-08 | Discharge: 2014-09-09 | Disposition: A | Discharge status: Home / Self Care | Payer: Self-pay | Attending: Emergency Medicine | Admitting: Emergency Medicine

## 2014-09-08 ENCOUNTER — Encounter (HOSPITAL_COMMUNITY): Payer: Self-pay | Admitting: Emergency Medicine

## 2014-09-08 DIAGNOSIS — J9801 Acute bronchospasm: Secondary | ICD-10-CM

## 2014-09-08 DIAGNOSIS — J4520 Mild intermittent asthma, uncomplicated: Secondary | ICD-10-CM

## 2014-09-08 DIAGNOSIS — J4521 Mild intermittent asthma with (acute) exacerbation: Secondary | ICD-10-CM | POA: Insufficient documentation

## 2014-09-08 DIAGNOSIS — Z8659 Personal history of other mental and behavioral disorders: Secondary | ICD-10-CM | POA: Insufficient documentation

## 2014-09-08 DIAGNOSIS — Z79899 Other long term (current) drug therapy: Secondary | ICD-10-CM | POA: Insufficient documentation

## 2014-09-08 DIAGNOSIS — G43909 Migraine, unspecified, not intractable, without status migrainosus: Secondary | ICD-10-CM | POA: Insufficient documentation

## 2014-09-08 MED ORDER — ALBUTEROL SULFATE (2.5 MG/3ML) 0.083% IN NEBU
5.0000 mg | INHALATION_SOLUTION | Freq: Once | RESPIRATORY_TRACT | Status: AC
  Administered 2014-09-08: 5 mg via RESPIRATORY_TRACT
  Filled 2014-09-08: qty 6

## 2014-09-08 NOTE — ED Notes (Signed)
Pt. reports asthma attack onset this evening with dry cough and wheezing , denies fever or chills.

## 2014-09-09 MED ORDER — PREDNISONE 20 MG PO TABS
60.0000 mg | ORAL_TABLET | Freq: Once | ORAL | Status: AC
  Administered 2014-09-09: 60 mg via ORAL
  Filled 2014-09-09: qty 3

## 2014-09-09 MED ORDER — PREDNISONE 20 MG PO TABS: 60.0000 mg | ORAL_TABLET | Freq: Every day | ORAL | Status: DC

## 2014-09-09 MED ORDER — ALBUTEROL SULFATE HFA 108 (90 BASE) MCG/ACT IN AERS
2.0000 | INHALATION_SPRAY | RESPIRATORY_TRACT | Status: DC | PRN
  Administered 2014-09-09: 2 via RESPIRATORY_TRACT
  Filled 2014-09-09: qty 6.7

## 2014-09-09 MED ORDER — ALBUTEROL SULFATE HFA 108 (90 BASE) MCG/ACT IN AERS: 2.0000 | INHALATION_SPRAY | RESPIRATORY_TRACT | Status: DC | PRN

## 2014-09-09 MED ORDER — IPRATROPIUM-ALBUTEROL 0.5-2.5 (3) MG/3ML IN SOLN
3.0000 mL | Freq: Once | RESPIRATORY_TRACT | Status: AC
  Administered 2014-09-09: 3 mL via RESPIRATORY_TRACT
  Filled 2014-09-09: qty 3

## 2014-09-09 NOTE — Discharge Instructions (Signed)
Asthma °Asthma is a recurring condition in which the airways tighten and narrow. Asthma can make it difficult to breathe. It can cause coughing, wheezing, and shortness of breath. Asthma episodes, also called asthma attacks, range from minor to life-threatening. Asthma cannot be cured, but medicines and lifestyle changes can help control it. °CAUSES °Asthma is believed to be caused by inherited (genetic) and environmental factors, but its exact cause is unknown. Asthma may be triggered by allergens, lung infections, or irritants in the air. Asthma triggers are different for each person. Common triggers include:  °· Animal dander. °· Dust mites. °· Cockroaches. °· Pollen from trees or grass. °· Mold. °· Smoke. °· Air pollutants such as dust, household cleaners, hair sprays, aerosol sprays, paint fumes, strong chemicals, or strong odors. °· Cold air, weather changes, and winds (which increase molds and pollens in the air). °· Strong emotional expressions such as crying or laughing hard. °· Stress. °· Certain medicines (such as aspirin) or types of drugs (such as beta-blockers). °· Sulfites in foods and drinks. Foods and drinks that may contain sulfites include dried fruit, potato chips, and sparkling grape juice. °· Infections or inflammatory conditions such as the flu, a cold, or an inflammation of the nasal membranes (rhinitis). °· Gastroesophageal reflux disease (GERD). °· Exercise or strenuous activity. °SYMPTOMS °Symptoms may occur immediately after asthma is triggered or many hours later. Symptoms include: °· Wheezing. °· Excessive nighttime or early morning coughing. °· Frequent or severe coughing with a common cold. °· Chest tightness. °· Shortness of breath. °DIAGNOSIS  °The diagnosis of asthma is made by a review of your medical history and a physical exam. Tests may also be performed. These may include: °· Lung function studies. These tests show how much air you breathe in and out. °· Allergy  tests. °· Imaging tests such as X-rays. °TREATMENT  °Asthma cannot be cured, but it can usually be controlled. Treatment involves identifying and avoiding your asthma triggers. It also involves medicines. There are 2 classes of medicine used for asthma treatment:  °· Controller medicines. These prevent asthma symptoms from occurring. They are usually taken every day. °· Reliever or rescue medicines. These quickly relieve asthma symptoms. They are used as needed and provide short-term relief. °Your health care provider will help you create an asthma action plan. An asthma action plan is a written plan for managing and treating your asthma attacks. It includes a list of your asthma triggers and how they may be avoided. It also includes information on when medicines should be taken and when their dosage should be changed. An action plan may also involve the use of a device called a peak flow meter. A peak flow meter measures how well the lungs are working. It helps you monitor your condition. °HOME CARE INSTRUCTIONS  °· Take medicines only as directed by your health care provider. Speak with your health care provider if you have questions about how or when to take the medicines. °· Use a peak flow meter as directed by your health care provider. Record and keep track of readings. °· Understand and use the action plan to help minimize or stop an asthma attack without needing to seek medical care. °· Control your home environment in the following ways to help prevent asthma attacks: °· Do not smoke. Avoid being exposed to secondhand smoke. °· Change your heating and air conditioning filter regularly. °· Limit your use of fireplaces and wood stoves. °· Get rid of pests (such as roaches and   mice) and their droppings. °· Throw away plants if you see mold on them. °· Clean your floors and dust regularly. Use unscented cleaning products. °· Try to have someone else vacuum for you regularly. Stay out of rooms while they are  being vacuumed and for a short while afterward. If you vacuum, use a dust mask from a hardware store, a double-layered or microfilter vacuum cleaner bag, or a vacuum cleaner with a HEPA filter. °· Replace carpet with wood, tile, or vinyl flooring. Carpet can trap dander and dust. °· Use allergy-proof pillows, mattress covers, and box spring covers. °· Wash bed sheets and blankets every week in hot water and dry them in a dryer. °· Use blankets that are made of polyester or cotton. °· Clean bathrooms and kitchens with bleach. If possible, have someone repaint the walls in these rooms with mold-resistant paint. Keep out of the rooms that are being cleaned and painted. °· Wash hands frequently. °SEEK MEDICAL CARE IF:  °· You have wheezing, shortness of breath, or a cough even if taking medicine to prevent attacks. °· The colored mucus you cough up (sputum) is thicker than usual. °· Your sputum changes from clear or white to yellow, green, gray, or bloody. °· You have any problems that may be related to the medicines you are taking (such as a rash, itching, swelling, or trouble breathing). °· You are using a reliever medicine more than 2-3 times per week. °· Your peak flow is still at 50-79% of your personal best after following your action plan for 1 hour. °· You have a fever. °SEEK IMMEDIATE MEDICAL CARE IF:  °· You seem to be getting worse and are unresponsive to treatment during an asthma attack. °· You are short of breath even at rest. °· You get short of breath when doing very little physical activity. °· You have difficulty eating, drinking, or talking due to asthma symptoms. °· You develop chest pain. °· You develop a fast heartbeat. °· You have a bluish color to your lips or fingernails. °· You are light-headed, dizzy, or faint. °· Your peak flow is less than 50% of your personal best. °MAKE SURE YOU:  °· Understand these instructions. °· Will watch your condition. °· Will get help right away if you are not  doing well or get worse. °Document Released: 10/14/2005 Document Revised: 02/28/2014 Document Reviewed: 05/13/2013 °[employer]® Patient Information ©2015 [employer]. This information is not intended to replace advice given to you by your health care provider. Make sure you discuss any questions you have with your health care provider. ° °Bronchospasm °A bronchospasm is a spasm or tightening of the airways going into the lungs. During a bronchospasm breathing becomes more difficult because the airways get smaller. When this happens there can be coughing, a whistling sound when breathing (wheezing), and difficulty breathing. Bronchospasm is often associated with asthma, but not all patients who experience a bronchospasm have asthma. °CAUSES  °A bronchospasm is caused by inflammation or irritation of the airways. The inflammation or irritation may be triggered by:  °· Allergies (such as to animals, pollen, food, or mold). Allergens that cause bronchospasm may cause wheezing immediately after exposure or many hours later.   °· Infection. Viral infections are believed to be the most common cause of bronchospasm.   °· Exercise.   °· Irritants (such as pollution, cigarette smoke, strong odors, aerosol sprays, and paint fumes).   °· Weather changes. Winds increase molds and pollens in the air. Rain refreshes the air by washing   irritants out. Cold air may cause inflammation.   °· Stress and emotional upset.   °SIGNS AND SYMPTOMS  °· Wheezing.   °· Excessive nighttime coughing.   °· Frequent or severe coughing with a simple cold.   °· Chest tightness.   °· Shortness of breath.   °DIAGNOSIS  °Bronchospasm is usually diagnosed through a history and physical exam. Tests, such as chest X-rays, are sometimes done to look for other conditions. °TREATMENT  °· Inhaled medicines can be given to open up your airways and help you breathe. The medicines can be given using either an inhaler or a nebulizer machine. °· Corticosteroid  medicines may be given for severe bronchospasm, usually when it is associated with asthma. °HOME CARE INSTRUCTIONS  °· Always have a plan prepared for seeking medical care. Know when to call your health care provider and local emergency services (911 in the [place] Know where you can access local emergency care. °· Only take medicines as directed by your health care provider. °· If you were prescribed an inhaler or nebulizer machine, ask your health care provider to explain how to use it correctly. Always use a spacer with your inhaler if you were given one. °· It is necessary to remain calm during an attack. Try to relax and breathe more slowly.  °· Control your home environment in the following ways:   °¨ Change your heating and air conditioning filter at least once a month.   °¨ Limit your use of fireplaces and wood stoves. °¨ Do not smoke and do not allow smoking in your home.   °¨ Avoid exposure to perfumes and fragrances.   °¨ Get rid of pests (such as roaches and mice) and their droppings.   °¨ Throw away plants if you see mold on them.   °¨ Keep your house clean and dust free.   °¨ Replace carpet with wood, tile, or vinyl flooring. Carpet can trap dander and dust.   °¨ Use allergy-proof pillows, mattress covers, and box spring covers.   °¨ Wash bed sheets and blankets every week in hot water and dry them in a dryer.   °¨ Use blankets that are made of polyester or cotton.   °¨ Wash hands frequently. °SEEK MEDICAL CARE IF:  °· You have muscle aches.   °· You have chest pain.   °· The sputum changes from clear or white to yellow, green, gray, or bloody.   °· The sputum you cough up gets thicker.   °· There are problems that may be related to the medicine you are given, such as a rash, itching, swelling, or trouble breathing.   °SEEK IMMEDIATE MEDICAL CARE IF:  °· You have worsening wheezing and coughing even after taking your prescribed medicines.   °· You have increased difficulty breathing.   °· You develop  severe chest pain. °MAKE SURE YOU:  °· Understand these instructions. °· Will watch your condition. °· Will get help right away if you are not doing well or get worse. °Document Released: 10/17/2003 Document Revised: 10/19/2013 Document Reviewed: 04/05/2013 °[employer]® Patient Information ©2015 [employer]. This information is not intended to replace advice given to you by your health care provider. Make sure you discuss any questions you have with your health care provider. ° °

## 2014-09-09 NOTE — ED Notes (Signed)
Pt. Refused wheelchair 

## 2014-09-09 NOTE — ED Provider Notes (Signed)
CSN: [id number]     Arrival date & time 09/08/14  2323 History   First MD Initiated Contact with Patient 09/08/14 2352     Chief Complaint  Patient presents with  . Asthma     (Consider location/radiation/quality/duration/timing/severity/associated sxs/prior Treatment) HPI Comments: Patient presents to the ER for evaluation of asthma attack. Patient reports history of asthma as a child, but hasn't had any problems in more than 10 years. She reports sudden onset of cough and wheezing tonight. Patient does not have an inhaler at home. She was given a breathing treatment upon arrival to the ER and has had much improvement. Patient denies any cold symptoms. No sick contacts. She does not have chest pain.  Patient is a 28 y.o. female presenting with asthma.  Asthma Associated symptoms include shortness of breath.    Past Medical History  Diagnosis Date  . Allergy   . Asthma   . Cerebral palsy   . Anxiety   . Migraine    Past Surgical History  Procedure Laterality Date  . Foot capsule release w/ percutaneous heel cord lengthening, tibial tendon transfer      lengthen a tight heel cord left leg   Family History  Problem Relation Age of Onset  . Suicidality Mother   . Cirrhosis Father   . Cancer Paternal Aunt     breast  . Diabetes Paternal Aunt   . Diabetes Paternal Grandfather   . Cancer Paternal Grandfather     prostate  . Alcohol abuse Mother   . Drug abuse Mother   . Depression Mother   . Anxiety disorder Mother    History  Substance Use Topics  . Smoking status: Never Smoker   . Smokeless tobacco: Never Used  . Alcohol Use: 0.6 oz/week    1 Glasses of wine per week     Comment: rare   OB History    Gravida Para Term Preterm AB TAB SAB Ectopic Multiple Living   1 0   1 1    0     Review of Systems  Respiratory: Positive for cough, shortness of breath and wheezing.   All other systems reviewed and are negative.     Allergies  Iohexol  Home Medications    Prior to Admission medications   Medication Sig Start Date End Date Taking? Authorizing Provider  Biotin 5 MG CAPS Take by mouth 2 (two) times daily.    Historical Provider, MD  drospirenone-ethinyl estradiol (YAZ,GIANVI,LORYNA) 3-0.02 MG tablet Take 1 tablet by mouth daily.    Historical Provider, MD  Multiple Vitamin (MULTIVITAMIN) tablet Take 1 tablet by mouth daily.    Historical Provider, MD  rizatriptan (MAXALT-MLT) 5 MG disintegrating tablet Take 1 tablet (5 mg total) by mouth as needed for migraine. May repeat in 2 hours if needed 04/27/14   Levert FeinsteinYijun Yan, MD  SUMAtriptan (IMITREX) 100 MG tablet Take 1 tablet (100 mg total) by mouth as needed for migraine. 05/27/13   Nelwyn SalisburyStephen A Fry, MD  topiramate (TOPAMAX) 100 MG tablet 1/2 po bid xone week, then one po bid 05/11/14   Levert FeinsteinYijun Yan, MD   BP 141/100 mmHg  Pulse 100  Temp(Src) 97.7 F (36.5 C)  Resp 26  Ht 5\' 9"  (1.753 m)  Wt 168 lb 9 oz (76.459 kg)  BMI 24.88 kg/m2  SpO2 97% Physical Exam  Constitutional: She is oriented to person, place, and time. She appears well-developed and well-nourished. No distress.  HENT:  Head: Normocephalic and atraumatic.  Right Ear: Hearing normal.  Left Ear: Hearing normal.  Nose: Nose normal.  Mouth/Throat: Oropharynx is clear and moist and mucous membranes are normal.  Eyes: Conjunctivae and EOM are normal. Pupils are equal, round, and reactive to light.  Neck: Normal range of motion. Neck supple.  Cardiovascular: Regular rhythm, S1 normal and S2 normal.  Exam reveals no gallop and no friction rub.   No murmur heard. Pulmonary/Chest: Effort normal. No respiratory distress. She has wheezes (slight at bases). She exhibits no tenderness.  Abdominal: Soft. Normal appearance and bowel sounds are normal. There is no hepatosplenomegaly. There is no tenderness. There is no rebound, no guarding, no tenderness at McBurney's point and negative Murphy's sign. No hernia.  Musculoskeletal: Normal range of motion.   Neurological: She is alert and oriented to person, place, and time. She has normal strength. No cranial nerve deficit or sensory deficit. Coordination normal. GCS eye subscore is 4. GCS verbal subscore is 5. GCS motor subscore is 6.  Skin: Skin is warm, dry and intact. No rash noted. No cyanosis.  Psychiatric: She has a normal mood and affect. Her speech is normal and behavior is normal. Thought content normal.  Nursing note and vitals reviewed.   ED Course  Procedures (including critical care time) Labs Review Labs Reviewed - No data to display  Imaging Review No results found.   EKG Interpretation None      MDM   Final diagnoses:  None  acute bronchospasm  Patient presents to the ER for evaluation of acute bronchospasm. Patient does have a history of asthma, but has not had problems in many years. Patient reported sudden onset of feeling like she was wheezing and shortness of breath. Symptoms significantly improved after nebulizer treatment. She had minimal wheezing upon my evaluation, had been treated at time of triage. Patient agrees that she does not have any residual shortness of breath at this time. Oxygenation is excellent. Air movement is excellent. Patient given an additional breathing treatment and will be discharged with an inhaler and initiate prednisone. Return if symptoms worsen.    Gilda Creasehristopher J. Daemian Gahm, MD 09/09/14 [id number]

## 2014-09-27 ENCOUNTER — Telehealth: Payer: Self-pay | Admitting: Neurology

## 2014-09-27 NOTE — Telephone Encounter (Signed)
Report Faxed. Dr.Yan in your box.

## 2014-09-27 NOTE — Telephone Encounter (Signed)
[facility]ed [facility] They are faxing report.

## 2014-09-27 NOTE — Telephone Encounter (Signed)
She had [facility] in July 2015, Annabelle HarmanDana, please find the result.  Call her back in 10/04/2014 to see if she still has zoon out episode,  She should keep current dose of Topamax 100 mg twice a day for her migraine prevention, and possible partial seizure

## 2014-09-27 NOTE — Telephone Encounter (Signed)
Patient stated Rx topiramate (TOPAMAX) 100 MG tablet causes memory issues, unfocused, and doesn't feel like herself.  Please call and may leave detailed message.

## 2014-09-27 NOTE — Telephone Encounter (Signed)
I have reviewed her video EEG [facility], dated August the for to August the fifth 2015, 23 hours to be due to ambulatory EEG monitoring, form discharge noted, there was no event button pushed, dominant background rhythm of 9 Hz, with average amplitude of 30 microvoltage, reactive to eye movement, no apparent abnormality, or asymmetry noted, intermittent triphasic waves noted,

## 2014-09-27 NOTE — Telephone Encounter (Signed)
I called back.  Spoke with patient.  She said for the past two weeks she has felt "zoned out", has had trouble with memory and word recall, and as well has had difficulty focusing.  Says her med regiment has not changed.  She consulted the pharmacist who believed these symptoms may be caused by Topamax.  The pharmacist advised patient to taper off of the medication, however, she has not done this, as she wishes to first consult Dr Terrace ArabiaYan.  Please advise.  Thank you.

## 2014-09-29 NOTE — Telephone Encounter (Signed)
Will call patient back in one week per Dr.Yan to check on her.

## 2014-10-03 NOTE — Telephone Encounter (Signed)
I have called her, left message, previous EEG was abnormal  Julia Anderson: Please give her a follow-up appointment with Julia Anderson in her next available, cancel previous appointment in February

## 2014-10-03 NOTE — Telephone Encounter (Signed)
Called patient and left her a message asking her How she was feeling asked patient to call me back.

## 2014-10-03 NOTE — Telephone Encounter (Signed)
Patient called me back and she feels the same and patient states she is having problems focusing, depression, . Patient states effecting her personal life and her Job. Please have Dr.Yan call me back if she needs me to come in. Can Dr.Yan tell me my EEG results again.

## 2014-10-03 NOTE — Telephone Encounter (Signed)
Patient returning AubreyDana call.

## 2014-10-03 NOTE — Telephone Encounter (Signed)
Spoke with patient offered first available with NP CM for 10/06/14 at 1:30 patient could not make this date due to her work schedule, patient has been scheduled with Dr Terrace ArabiaYan for 10/07/14 at 11 am.

## 2014-10-06 ENCOUNTER — Encounter: Payer: Self-pay | Admitting: Diagnostic Neuroimaging

## 2014-10-07 ENCOUNTER — Ambulatory Visit (INDEPENDENT_AMBULATORY_CARE_PROVIDER_SITE_OTHER): Payer: Self-pay | Admitting: Neurology

## 2014-10-07 ENCOUNTER — Encounter: Payer: Self-pay | Admitting: Neurology

## 2014-10-07 VITALS — BP 111/69 | HR 67 | Ht 69.0 in | Wt 169.0 lb

## 2014-10-07 DIAGNOSIS — G43009 Migraine without aura, not intractable, without status migrainosus: Secondary | ICD-10-CM

## 2014-10-07 DIAGNOSIS — G44229 Chronic tension-type headache, not intractable: Secondary | ICD-10-CM

## 2014-10-07 DIAGNOSIS — R42 Dizziness and giddiness: Secondary | ICD-10-CM

## 2014-10-07 DIAGNOSIS — G43011 Migraine without aura, intractable, with status migrainosus: Secondary | ICD-10-CM

## 2014-10-07 MED ORDER — ZONISAMIDE 100 MG PO CAPS: ORAL_CAPSULE | ORAL | Status: DC

## 2014-10-07 NOTE — Patient Instructions (Signed)
You are taking Topamax 100 mg 1 tablets twice day  1, tapering off Topamax, 1 tablets every night for one week then stop 2. Start a new medication Zonegran 100 mg, first week 1 tablet in the morning    Second week 1 tablet twice a day   3. RTC in 2 months

## 2014-10-07 NOTE — Progress Notes (Signed)
PATIENT: Julia PianoRebecca L Anderson DOB: 04/11/1986  HISTORICAL (Initial visit July 1st 2015)  Julia PianoRebecca L Alers is a 28 years old right-handed Caucasian female, referred by her primary care physician PA Amy Boucherle for evaluation of headaches  She had past medical history of cerebral palsy, with gait difficulty, chronic migraine headaches,  She noticed almost constant left temporal, frontal area pressure sensation since age 28, daily constant, also had a history of migraine headaches, become much more frequent since November 2014, couple times a week, the pressure on the left side would exacerbate to a painful pounding headaches, with associated light noise sensitivity, sometimes nauseous, vomiting  She was recently given sample of Imitrex, which has worked well for her, which take away her headache away in 30 minutes, otherwise, her headache can linger around for one day.    UPDATE July 15th 2015:  Julia JoinerRebecca is with her aunt at today's clinical visit.  She was not able to afford Topamax, she has to take 2 or 3 times ibuprofen, Tylenol, or Imitrex sample each week because of her headaches  She has abnormal EEG. There was evidence of short protrusion of generalized slowing waves, indicating bicerebral hemisphere malfunction  She reported episode of zoom out, especially when she drives, she could not focus, then come back, there was also episodes of difficulty reading, aunt reported that she has episodes of zooning out that  she snap out of   I have reviewed MRI of the brain July 2015 at [facility], showed multiple posterior periventricular, periatrial and subcortical bilateral white matter hyperintensities which are nonspecific, most likely small vessel disease, but radiologist also raise the possibility of demyelinating disease though vasculitis, small vessel disease, autoimmune or infectious conditions are also possible  UPDATE August 20th 2015:  She is taking Topamax 100mg  bid, she paid  #28 for 3 months supply, she does not have headaches much. No zooming out episodes. She is complaining alternating of the taste, some weight loss,  She is no longer staying with her aunt, she is staying with her grandparents now, she is a Careers information officerpharmarcy tech.  Repeat EEG showed generalized slowing, she had Video-EEG monitoring around August 4th, report still pending.  UPDATE Dec 11th 2015: [facility], August 4th to fifth 2015, 23 hours video ambulatory EEG monitoring, there was no event button pushed, dominant background rhythm of 9 Hz, with average amplitude of 30 microvoltage, reactive to eye movement, no apparent abnormality, or asymmetry noted, intermittent triphasic waves noted,  Her headache went away with Topamax treatment, she also lost some weight, she no longer has those spells  of difficulty focusing, zoning out, but she complains of short-term memory trouble, to the point of 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 Iohexol      Desc: Pt had reaction to IV cm of itching and breathing heavy at [facility] back in 2006. Given benadryl at the time by Mom.     HOME MEDICATIONS: Current Outpatient Prescriptions on File Prior to Visit  Medication Sig Dispense Refill  . Multiple Vitamin (MULTIVITAMIN) tablet Take 1 tablet by mouth daily.      . SUMAtriptan (IMITREX) 100 MG tablet Take 1 tablet (  100 mg total) by mouth as needed for migraine.  10 tablet  11  . VESTURA 3-0.02 MG tablet TAKE 1 TABLET BY MOUTH EVERY DAY  28 tablet  11     PAST MEDICAL HISTORY: Past Medical History  Diagnosis Date  . Allergy   . Asthma   . Cerebral palsy   . Anxiety   . Migraine      PAST SURGICAL HISTORY: Past Surgical History  Procedure Laterality Date  . Foot capsule release w/ percutaneous heel cord lengthening, tibial tendon transfer      lengthen a tight heel cord left leg    FAMILY HISTORY: Family History  Problem Relation Age of Onset  . Suicidality Mother   . Cirrhosis Father   . Cancer Paternal Aunt     breast  . Diabetes Paternal Aunt   . Diabetes Paternal Grandfather   . Cancer Paternal Grandfather     prostate  . Alcohol abuse Mother   . Drug abuse Mother   . Depression Mother   . Anxiety disorder Mother     SOCIAL HISTORY:  History   Social History  . Marital Status: Single    Spouse Name: N/A    Number of Children: 0  . Years of Education: N/A   Occupational History  . cosmetic advisor    Social History Main Topics  . Smoking status: Never Smoker   . Smokeless tobacco: Never Used  . Alcohol Use: 0.6 oz/week    1 Glasses of wine per week     Comment: rare  . Drug Use: No  . Sexual Activity:    Partners: Male   Other Topics Concern  . Not on file   Social History Narrative   Patient lives at home with her grandparents. Patient works full time at [employer]   Education some college.   Right handed.   Caffeine one cup of coffee daily .    PHYSICAL EXAM   Filed Vitals:   10/07/14 1053  BP: 111/69  Pulse: 67  Height: 5\' 9"  (1.753 m)  Weight: 169 lb (76.658 kg)   Body mass index is 24.95 kg/(m^2).   Generalized: In no acute distress  Neck: Supple, no carotid bruits   Cardiac: Regular rate rhythm  Pulmonary: Clear to auscultation bilaterally  Neurological examination  Mentation: Alert oriented to time, place, history taking, and causual conversation  Cranial nerve II-XII: Pupils were equal round reactive to light. She has bilateral exotropia, with limitation at bilateral adduction.  Visual field were full on confrontational test. Bilateral fundi were sharp.  Facial sensation and strength were normal.  Hearing was intact to finger rubbing bilaterally. Uvula tongue midline.  Head turning and shoulder shrug and were normal and symmetric.Tongue protrusion into cheek strength was normal.  Motor: She has limited range of motion of her bilateral ankles, especially left toes, mild to moderate spasticity of bilateral lower extremity  Sensory: Intact to fine touch, pinprick, preserved vibratory sensation, and proprioception at toes.  Coordination: Normal finger to nose, heel-to-shin bilaterally there was no truncal ataxia  Gait: She tends to walk with tiptoes, bilateral valgrus knee, stiff gait  Deep tendon reflexes: Brachioradialis 2/2, biceps 2/2, triceps 2/2, patellar 2/2, Achilles 2/2, plantar responses were flexor bilaterally.   DIAGNOSTIC DATA (LABS, IMAGING, TESTING) - I reviewed patient records, labs, notes, testing and imaging myself where available.  Lab Results  Component Value Date   WBC 4.3* 01/20/2013   HGB 13.7 01/20/2013   HCT 40.2 01/20/2013  MCV 96.0 01/20/2013   PLT 206.0 01/20/2013      Component Value Date/Time   NA 140 01/20/2013 0954   K 4.9 01/20/2013 0954   CL 105 01/20/2013 0954   CO2 27 01/20/2013 0954   GLUCOSE 84 01/20/2013 0954   BUN 14 01/20/2013 0954   CREATININE 0.7 01/20/2013 0954   CALCIUM 9.5 01/20/2013 0954   PROT 7.7 01/20/2013 0954   ALBUMIN 4.1 01/20/2013 0954   AST 20 01/20/2013 0954   ALT 17 01/20/2013 0954   ALKPHOS 55 01/20/2013 0954   BILITOT 0.5 01/20/2013 0954   GFRNONAA 112.07 11/09/2009 1228   GFRAA 164 11/25/2008 1039   Lab Results  Component Value Date   CHOL 160 01/20/2013   HDL 62.30 01/20/2013   LDLCALC 89 01/20/2013   TRIG 44.0 01/20/2013   CHOLHDL 3 01/20/2013   Lab Results  Component Value Date   TSH 1.80 12/22/2013    ASSESSMENT AND PLAN  GULIANNA HORNSBY is a 28 y.o. female with history of cerebral palsy, complains of persistent left-sided pressure, presenting with increased frequency of headaches, with  migraine features, we have reviewed MRI of brain together, The brain parenchyma shows multiple posterior periventricular, periatrial and subcortical bilateral white matter hyperintensities which are nonspecific but may be seen with demyelinating disease though vasculitis, small vessel disease, autoimmune or infectious conditions are also possible  EEG showed protrusion of transient slow waves, 24 hours video-eeg monitoring showed occasional intrusion of triphasic waves, no evidence of epileptiform form discharge,  Topamax 100 mg twice a day he has been very helpful, however, she has developed side effect of forgetfulness, desires changes, I will change her to Zonegran 100 mg twice a day Return to clinic in 2-3 months  Levert Feinstein, M.D. Ph.D.  [facility] [address], [address] [place], [place] [address] [phone]

## 2014-12-19 ENCOUNTER — Ambulatory Visit: Payer: 59 | Admitting: Nurse Practitioner

## 2014-12-20 ENCOUNTER — Encounter: Payer: Self-pay | Admitting: Nurse Practitioner

## 2021-10-22 ENCOUNTER — Telehealth: Payer: Self-pay | Admitting: Nurse Practitioner

## 2021-10-22 DIAGNOSIS — J0140 Acute pansinusitis, unspecified: Secondary | ICD-10-CM

## 2021-10-22 MED ORDER — AMOXICILLIN-POT CLAVULANATE 875-125 MG PO TABS: 1.0000 | ORAL_TABLET | Freq: Two times a day (BID) | ORAL | 0 refills | Status: AC

## 2021-10-22 NOTE — Progress Notes (Signed)
Virtual Visit Consent   Julia Anderson, you are scheduled for a virtual visit with a [facility] provider today.     Just as with appointments in the office, your consent must be obtained to participate.  Your consent will be active for this visit and any virtual visit you may have with one of our providers in the next 365 days.     If you have a MyChart account, a copy of this consent can be sent to you electronically.  All virtual visits are billed to your insurance company just like a traditional visit in the office.    As this is a virtual visit, video technology does not allow for your provider to perform a traditional examination.  This may limit your provider's ability to fully assess your condition.  If your provider identifies any concerns that need to be evaluated in person or the need to arrange testing (such as labs, EKG, etc.), we will make arrangements to do so.     Although advances in technology are sophisticated, we cannot ensure that it will always work on either your end or our end.  If the connection with a video visit is poor, the visit may have to be switched to a telephone visit.  With either a video or telephone visit, we are not always able to ensure that we have a secure connection.     I need to obtain your verbal consent now.   Are you willing to proceed with your visit today?    Julia Anderson has provided verbal consent on 10/22/2021 for a virtual visit (video or telephone).   Julia Simas, FNP   Date: 10/22/2021 10:43 AM   Virtual Visit via Video Note   I, Julia Anderson, connected with  Julia Anderson  ([id number], 07/07/1986) on 10/22/21 at 10:45 AM EST by a video-enabled telemedicine application and verified that I am speaking with the correct person using two identifiers.  Location: Patient: Virtual Visit Location Patient: Home Provider: Virtual Visit Location Provider: Home Office   I discussed the limitations of evaluation and management by  telemedicine and the availability of in person appointments. The patient expressed understanding and agreed to proceed.    History of Present Illness: Julia Anderson is a 35 y.o. who identifies as a female who was assigned female at birth, and is being seen today with complaints of nasal congestion and pressure in her ears. She has a productive cough as well. She has been sick now for the past week but symptoms worsened yesterday afternoon.   She denies a fever.  She took a home COVID test yesterday that was negative.   She has been vaccinated for COVID  She has also had a flu vaccine this year.   She has a history of asthma but has not needed an inhaler in the past 5 years.   She is able to take deep breaths and denies wheezing or chest tightness.   She does take Zyrtec daily and is using Tylenol cold and sinus as well.   Problems:  Patient Active Problem List   Diagnosis Date Noted   Migraine    Migraines 10/12/2013   DEPRESSION 05/29/2010   TEMPOROMANDIBULAR JOINT PAIN 05/29/2010   CHRONIC TENSION HEADACHE 04/03/2010   DIZZINESS 11/09/2009   ACUTE SINUSITIS, UNSPECIFIED 08/21/2009   GRIEF REACTION 07/21/2009   ACUTE BRONCHITIS 11/17/2008   ACUTE CYSTITIS 10/14/2008   CEREBRAL PALSY 05/11/2007   ALLERGIC RHINITIS 05/11/2007   ASTHMA 05/11/2007  Allergies  Allergen Reactions   Phentermine Other (See Comments)    Irritability   Fluvoxamine Other (See Comments)    Suicidal Ideation Suicidal Ideation    Iodinated Contrast Media Other (See Comments)     Desc: Pt had reaction to IV cm of itching and breathing heavy at [facility] back in 2006. Given benadryl at the time by Mom.  Desc: Pt had reaction to IV cm of itching and breathing heavy at [facility] back in 2006. Given benadryl at the time by Mom. Breathing heavy; panic attack in 2006 Breathing heavy; panic attack in 2006  Desc: Pt had reaction to IV cm of itching and breathing heavy at [facility] back in 2006. Given benadryl at  the time by Mom.    Iohexol      Desc: Pt had reaction to IV cm of itching and breathing heavy at [facility] back in 2006. Given benadryl at the time by Mom.    Other Other (See Comments)    Cat and pollen-itching   Sertraline Other (See Comments)    Irritability Irritability     Current Outpatient Medications  Medication Instructions   cetirizine (ZYRTEC) 10 MG tablet Oral   drospirenone-ethinyl estradiol (VESTURA) 3-0.02 MG tablet 1 tablet, Oral, Daily   fluticasone (FLONASE) 50 MCG/ACT nasal spray 1-2 sprays in each nostril twice daily   Multiple Vitamin (MULTIVITAMIN) tablet 1 tablet, Daily     Observations/Objective: Patient is well-developed, well-nourished in no acute distress.  Resting comfortably at home.  Head is normocephalic, atraumatic.  No labored breathing.  Speech is clear and coherent with logical content.  Patient is alert and oriented at baseline.    Assessment and Plan: 1. Acute non-recurrent pansinusitis  - amoxicillin-clavulanate (AUGMENTIN) 875-125 MG tablet; Take 1 tablet by mouth 2 (two) times daily for 7 days. Take with food  Dispense: 14 tablet; Refill: 0    Continue allergy regimen and OTC tylenol cold and flu for congestion relief.   Follow Up Instructions: I discussed the assessment and treatment plan with the patient. The patient was provided an opportunity to ask questions and all were answered. The patient agreed with the plan and demonstrated an understanding of the instructions.  A copy of instructions were sent to the patient via MyChart unless otherwise noted below.     The patient was advised to call back or seek an in-person evaluation if the symptoms worsen or if the condition fails to improve as anticipated.  Time:  I spent 10 minutes with the patient via telehealth technology discussing the above problems/concerns.    Julia Simas, FNP

## 2021-10-29 ENCOUNTER — Other Ambulatory Visit (HOSPITAL_COMMUNITY): Payer: Self-pay

## 2021-11-13 ENCOUNTER — Other Ambulatory Visit (HOSPITAL_COMMUNITY): Payer: Self-pay

## 2021-11-21 ENCOUNTER — Other Ambulatory Visit (HOSPITAL_COMMUNITY): Payer: Self-pay

## 2021-11-21 MED ORDER — DROSPIRENONE-ETHINYL ESTRADIOL 3-0.02 MG PO TABS
1.0000 | ORAL_TABLET | Freq: Every day | ORAL | 10 refills | Status: AC
  Filled 2021-11-21: qty 28, 28d supply, fill #0
  Filled 2021-12-07: qty 84, 84d supply, fill #1
  Filled 2022-02-04 – 2022-02-19 (×2): qty 84, 84d supply, fill #2
  Filled 2022-04-19 – 2022-05-09 (×2): qty 84, 84d supply, fill #3
  Filled 2022-08-27: qty 28, 28d supply, fill #4

## 2021-12-07 ENCOUNTER — Other Ambulatory Visit (HOSPITAL_COMMUNITY): Payer: Self-pay

## 2021-12-10 ENCOUNTER — Other Ambulatory Visit (HOSPITAL_COMMUNITY): Payer: Self-pay

## 2021-12-11 ENCOUNTER — Other Ambulatory Visit (HOSPITAL_COMMUNITY): Payer: Self-pay

## 2021-12-13 ENCOUNTER — Other Ambulatory Visit (HOSPITAL_COMMUNITY): Payer: Self-pay

## 2022-01-15 ENCOUNTER — Other Ambulatory Visit (HOSPITAL_COMMUNITY): Payer: Self-pay

## 2022-01-15 MED ORDER — CARESTART COVID-19 HOME TEST VI KIT
PACK | 0 refills | Status: DC
  Filled 2022-01-15: qty 8, 8d supply, fill #0

## 2022-01-16 ENCOUNTER — Other Ambulatory Visit (HOSPITAL_COMMUNITY): Payer: Self-pay

## 2022-01-16 MED ORDER — CITALOPRAM HYDROBROMIDE 10 MG PO TABS
10.0000 mg | ORAL_TABLET | Freq: Every day | ORAL | 0 refills | Status: DC
  Filled 2022-01-16: qty 30, 30d supply, fill #0

## 2022-01-24 ENCOUNTER — Telehealth: Payer: Self-pay | Admitting: Family Medicine

## 2022-01-24 DIAGNOSIS — J3089 Other allergic rhinitis: Secondary | ICD-10-CM

## 2022-01-24 MED ORDER — FLUTICASONE PROPIONATE 50 MCG/ACT NA SUSP: 2.0000 | Freq: Every day | NASAL | 0 refills | Status: DC

## 2022-01-24 MED ORDER — LEVOCETIRIZINE DIHYDROCHLORIDE 5 MG PO TABS: 5.0000 mg | ORAL_TABLET | Freq: Every evening | ORAL | 0 refills | Status: DC

## 2022-01-24 NOTE — Progress Notes (Signed)
E visit for Allergic Rhinitis ?We are sorry that you are not feeling well.  Here is how we plan to help! ? ?Based on what you have shared with me it looks like you have Allergic Rhinitis.  Rhinitis is when a reaction occurs that causes nasal congestion, runny nose, sneezing, and itching.  Most types of rhinitis are caused by an inflammation and are associated with symptoms in the eyes ears or throat. ?There are several types of rhinitis.  The most common are acute rhinitis, which is usually caused by a viral illness, allergic or seasonal rhinitis, and nonallergic or year-round rhinitis.  Nasal allergies occur certain times of the year.  Allergic rhinitis is caused when allergens in the air trigger the release of histamine in the body.  Histamine causes itching, swelling, and fluid to build up in the fragile linings of the nasal passages, sinuses and eyelids.  An itchy nose and clear discharge are common. ? ?I recommend the following over the counter treatments: ?Xyzal 5 mg take 1 tablet daily I would stop taking Zyrtec and try this one for a while and see if it helps. ? ?I also would recommend a nasal spray: ?Flonase 2 sprays into each nostril once daily and Saline 1 spray into each nostril as needed ? ?You may also benefit from eye drops such as: ?Systane 1-2 driops each eye twice daily as needed ? ?HOME CARE: ? ?You can use an over-the-counter saline nasal spray as needed ?Avoid areas where there is heavy dust, mites, or molds ?Stay indoors on windy days during the pollen season ?Keep windows closed in home, at least in bedroom; use air conditioner. ?Use high-efficiency house air filter ?Keep windows closed in car, turn [facility] on re-circulate ?Avoid playing out with dog during pollen season ? ?GET HELP RIGHT AWAY IF: ? ?If your symptoms do not improve within 10 days ?You become short of breath ?You develop yellow or green discharge from your nose for over 3 days ?You have coughing fits ? ?MAKE SURE YOU: ? ?Understand  these instructions ?Will watch your condition ?Will get help right away if you are not doing well or get worse ? ?Thank you for choosing an e-visit. ?Your e-visit answers were reviewed by a board certified advanced clinical practitioner to complete your personal care plan. Depending upon the condition, your plan could have included both over the counter or prescription medications. ?Please review your pharmacy choice. Be sure that the pharmacy you have chosen is open so that you can pick up your prescription now.  If there is a problem you may message your provider in [facility] to have the prescription routed to another pharmacy. ?Your safety is important to [place]. If you have drug allergies check your prescription carefully.  ?For the next 24 hours, you can use MyChart to ask questions about today?s visit, request a non-urgent call back, or ask for a work or school excuse from your e-visit provider. ?You will get an email in the next two days asking about your experience. I hope that your e-visit has been valuable and will speed your recovery. ? ? ? ? ? ? ?I provided 5 minutes of non face-to-face time during this encounter for chart review, medication and order placement, as well as and documentation.  ? ?

## 2022-01-30 ENCOUNTER — Other Ambulatory Visit (HOSPITAL_COMMUNITY): Payer: Self-pay

## 2022-01-30 ENCOUNTER — Telehealth: Payer: Self-pay | Admitting: Urgent Care

## 2022-01-30 ENCOUNTER — Encounter: Payer: Self-pay | Admitting: Urgent Care

## 2022-01-30 DIAGNOSIS — J040 Acute laryngitis: Secondary | ICD-10-CM

## 2022-01-30 DIAGNOSIS — J019 Acute sinusitis, unspecified: Secondary | ICD-10-CM

## 2022-01-30 DIAGNOSIS — Z8709 Personal history of other diseases of the respiratory system: Secondary | ICD-10-CM

## 2022-01-30 DIAGNOSIS — R0982 Postnasal drip: Secondary | ICD-10-CM

## 2022-01-30 DIAGNOSIS — B9789 Other viral agents as the cause of diseases classified elsewhere: Secondary | ICD-10-CM

## 2022-01-30 MED ORDER — AZELASTINE HCL 0.1 % NA SOLN
2.0000 | Freq: Two times a day (BID) | NASAL | 0 refills | Status: DC
  Filled 2022-01-30: qty 30, 25d supply, fill #0

## 2022-01-30 MED ORDER — PREDNISONE 50 MG PO TABS
50.0000 mg | ORAL_TABLET | Freq: Every day | ORAL | 0 refills | Status: AC
  Filled 2022-01-30: qty 3, 3d supply, fill #0

## 2022-01-30 MED ORDER — MONTELUKAST SODIUM 10 MG PO TABS
10.0000 mg | ORAL_TABLET | Freq: Every day | ORAL | 0 refills | Status: DC
  Filled 2022-01-30: qty 30, 30d supply, fill #0

## 2022-01-30 NOTE — Patient Instructions (Signed)
?[name], thank you for joining Chaney Malling, PA for today's virtual visit.  While this provider is not your primary care provider (PCP), if your PCP is located in our provider database this encounter information will be shared with them immediately following your visit. ? ?Consent: ?(Patient) Julia Anderson provided verbal consent for this virtual visit at the beginning of the encounter. ? ?Current Medications: ? ?Current Outpatient Medications:  ?  azelastine (ASTELIN) 0.1 % nasal spray, Place 2 sprays into both nostrils in the morning and at bedtime., Disp: 30 mL, Rfl: 0 ?  montelukast (SINGULAIR) 10 MG tablet, Take 1 tablet (10 mg total) by mouth at bedtime., Disp: 30 tablet, Rfl: 0 ?  predniSONE (DELTASONE) 50 MG tablet, Take 1 tablet (50 mg total) by mouth daily with breakfast for 3 days., Disp: 3 tablet, Rfl: 0 ?  citalopram (CELEXA) 10 MG tablet, Take 1 tablet (10 mg total) by mouth daily., Disp: 30 tablet, Rfl: 0 ?  COVID-19 At Home Antigen Test [facility] COVID-19 HOME TEST) KIT, Use as directed, Disp: 8 each, Rfl: 0 ?  drospirenone-ethinyl estradiol (LORYNA) 3-0.02 MG tablet, Take 1 tablet by mouth daily., Disp: 28 tablet, Rfl: 10 ?  drospirenone-ethinyl estradiol (VESTURA) 3-0.02 MG tablet, Take 1 tablet by mouth daily., Disp: , Rfl:  ?  fluticasone (FLONASE) 50 MCG/ACT nasal spray, Place 2 sprays into both nostrils daily., Disp: 16 g, Rfl: 0 ?  levocetirizine (XYZAL) 5 MG tablet, Take 1 tablet (5 mg total) by mouth every evening., Disp: 30 tablet, Rfl: 0 ?  Multiple Vitamin (MULTIVITAMIN) tablet, Take 1 tablet by mouth daily., Disp: , Rfl:   ? ?Medications ordered in this encounter:  ?Meds ordered this encounter  ?Medications  ? montelukast (SINGULAIR) 10 MG tablet  ?  Sig: Take 1 tablet (10 mg total) by mouth at bedtime.  ?  Dispense:  30 tablet  ?  Refill:  0  ?  Order Specific Question:   Supervising Provider  ?  Answer:   Noemi Chapel [[id number]]  ? predniSONE (DELTASONE) 50 MG tablet   ?  Sig: Take 1 tablet (50 mg total) by mouth daily with breakfast for 3 days.  ?  Dispense:  3 tablet  ?  Refill:  0  ?  Order Specific Question:   Supervising Provider  ?  Answer:   Noemi Chapel [[id number]]  ? azelastine (ASTELIN) 0.1 % nasal spray  ?  Sig: Place 2 sprays into both nostrils in the morning and at bedtime.  ?  Dispense:  30 mL  ?  Refill:  0  ?  Order Specific Question:   Supervising Provider  ?  Answer:   Noemi Chapel [[id number]]  ?  ? ?*If you need refills on other medications prior to your next appointment, please contact your pharmacy* ? ?Follow-Up: ?Call back or seek an in-person evaluation if the symptoms worsen or if the condition fails to improve as anticipated. ? ?Other Instructions ?You currently are suffering from laryngitis and viral sinusitis, which likely is stemming from your known history of pollen allergies. Despite supportive therapy with antihistamines, decongestants and nasal sprays thus far, your symptoms continue to worsen. Therefore, I recommend you continue use of your xyzal and flonase.  I have called in three additional medications.  ?Take one tablet of prednisone daily for the next three days. Side effects include irritability, increased hunger and insomnia, so best to take in the morning. ?Take one tablet of montelukast each night before bed.  ?  Add azelastine nasal spray to your current flonase therapy. It is safe and effective to take both concurrently. Stop if bleeding or nasal irritation occurs. ?Steam from a hot shower or humidifier at night may also provide some benefit. Rest your voice when possible. ?You should start noticing an improvement to your symptoms. Should they persist >5 days, please reach back out for a re-evaluation. Monitor for fevers, shortness of breath or wheezing. ? ? ?If you have been instructed to have an in-person evaluation today at a local Urgent Care facility, please use the link below. It will take you to a list of all of our available [facility], including address, phone number and hours of operation. Please do not delay care.  ?[facility] ? ?If you or a family member do not have a primary care provider, use the link below to schedule a visit and establish care. When you choose a [facility] primary care physician or advanced practice provider, you gain a long-term partner in health. ?Find a Primary Care Provider ? ?Learn more about [facility]'s in-office and virtual care options: ?[facility]  ?

## 2022-01-30 NOTE — Progress Notes (Signed)
?Virtual Visit Consent  ? ?Julia Anderson, you are scheduled for a virtual visit with a [facility] provider today.   ?  ?Just as with appointments in the office, your consent must be obtained to participate.  Your consent will be active for this visit and any virtual visit you may have with one of our providers in the next 365 days.   ?  ?If you have a MyChart account, a copy of this consent can be sent to you electronically.  All virtual visits are billed to your insurance company just like a traditional visit in the office.   ? ?As this is a virtual visit, video technology does not allow for your provider to perform a traditional examination.  This may limit your provider's ability to fully assess your condition.  If your provider identifies any concerns that need to be evaluated in person or the need to arrange testing (such as labs, EKG, etc.), we will make arrangements to do so.   ?  ?Although advances in technology are sophisticated, we cannot ensure that it will always work on either your end or our end.  If the connection with a video visit is poor, the visit may have to be switched to a telephone visit.  With either a video or telephone visit, we are not always able to ensure that we have a secure connection.    ? ?I need to obtain your verbal consent now.   Are you willing to proceed with your visit today?  ?  ?Julia Anderson has provided verbal consent on 01/30/2022 for a virtual visit (video or telephone). ?  ?Julia Vegh L Maripat Borba, PA  ? ?Date: 01/30/2022 11:55 AM ? ? ?Virtual Visit via Video Note  ? ?I, Julia Anderson, connected with  Julia Anderson  ([id number], 1986-01-03) on 01/30/22 at 11:45 AM EDT by a video-enabled telemedicine application and verified that I am speaking with the correct person using two identifiers. ? ?Location: ?Patient: Virtual Visit Location Patient: Other: Work Location manager) ?Provider: Virtual Visit Location Provider: Home Office ?  ?I discussed the limitations of  evaluation and management by telemedicine and the availability of in person appointments. The patient expressed understanding and agreed to proceed.   ? ?History of Present Illness: ?Julia Anderson is a 36 y.o. who identifies as a female who was assigned female at birth, and is being seen today for hoarse voice, cough and frontal sinus pressure. She has been seen twice now in the past week for worsening sinus pressure and allergies. It was recommended that she take antihistamines (both claritin and zyrtec), flonase nasal spray, and sudafed. She has been taking all Rx as recommended but feels her symptoms have worsened. She is now starting to have a productive cough, post nasal drainage, and hoarse voice. She can feel the mucous draining down her posterior pharynx. She states her frontal sinuses become pressured when leaning forward, but denies tooth pain or fever. She is coughing up thick green and yellow, but no blood. No SOB, wheezing, CP or palpitations. She does not smoke. She has a known hx of recurrent sinusitis, allergic rhinitis, and asthma. She took a home Covid test and it was negative. ? ?HPI: HPI  ?Problems:  ?Patient Active Problem List  ? Diagnosis Date Noted  ? Migraine   ? Migraines 10/12/2013  ? DEPRESSION 05/29/2010  ? TEMPOROMANDIBULAR JOINT PAIN 05/29/2010  ? 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 DIZZINESS 11/09/2009  ? ACUTE SINUSITIS, UNSPECIFIED  08/21/2009  ? GRIEF REACTION 07/21/2009  ? ACUTE BRONCHITIS 11/17/2008  ? ACUTE CYSTITIS 10/14/2008  ? CEREBRAL PALSY 05/11/2007  ? ALLERGIC RHINITIS 05/11/2007  ? ASTHMA 05/11/2007  ?  ?Allergies:  ?Allergies  ?Allergen Reactions  ? Phentermine Other (See Comments)  ?  Irritability  ? Fluvoxamine Other (See Comments)  ?  Suicidal Ideation ?Suicidal Ideation ?  ? Iodinated Contrast Media Other (See Comments)  ?   Desc: Pt had reaction to IV cm of itching and breathing heavy at [facility] back in 2006. Given benadryl at the time by Mom. ? Desc: Pt had  reaction to IV cm of itching and breathing heavy at [facility] back in 2006. Given benadryl at the time by Mom. ?Breathing heavy; panic attack in 2006 ?Breathing heavy; panic attack in 2006 ? Desc: Pt had reaction to IV cm of itching and breathing heavy at [facility] back in 2006. Given benadryl at the time by Mom. ?  ? Iohexol   ?   Desc: Pt had reaction to IV cm of itching and breathing heavy at [facility] back in 2006. Given benadryl at the time by Mom. ?  ? Other Other (See Comments)  ?  Cat and pollen-itching  ? Sertraline Other (See Comments)  ?  Irritability ?Irritability ?  ? ?Medications:  ?Current Outpatient Medications:  ?  citalopram (CELEXA) 10 MG tablet, Take 1 tablet (10 mg total) by mouth daily., Disp: 30 tablet, Rfl: 0 ?  COVID-19 At Home Antigen Test [facility] COVID-19 HOME TEST) KIT, Use as directed, Disp: 8 each, Rfl: 0 ?  drospirenone-ethinyl estradiol (LORYNA) 3-0.02 MG tablet, Take 1 tablet by mouth daily., Disp: 28 tablet, Rfl: 10 ?  drospirenone-ethinyl estradiol (VESTURA) 3-0.02 MG tablet, Take 1 tablet by mouth daily., Disp: , Rfl:  ?  fluticasone (FLONASE) 50 MCG/ACT nasal spray, Place 2 sprays into both nostrils daily., Disp: 16 g, Rfl: 0 ?  levocetirizine (XYZAL) 5 MG tablet, Take 1 tablet (5 mg total) by mouth every evening., Disp: 30 tablet, Rfl: 0 ?  Multiple Vitamin (MULTIVITAMIN) tablet, Take 1 tablet by mouth daily., Disp: , Rfl:  ? ?Observations/Objective: ?Patient is well-developed, well-nourished in no acute distress.  ?Resting comfortably in the break room at work.  ?Head is normocephalic, atraumatic.  ?No labored breathing. Hoarse, cracking, nasally voice. Very congested nasal passages without any visible rhinorrhea.  ?Speech is clear and coherent with logical content.  ?Patient is alert and oriented at baseline.  ?No coughing heard on exam. ?No visible rash. ?No scleral or conjunctival injection. ? ?Assessment and Plan: ?1. Laryngitis, acute ? ?2. Acute viral sinusitis ? ?3. Post-nasal  drainage ? ?4. History of asthma ? ?You currently are suffering from laryngitis and viral sinusitis, which likely is stemming from your known history of pollen allergies. Despite supportive therapy with antihistamines, decongestants and nasal sprays thus far, your symptoms continue to worsen. Therefore, I recommend you continue use of your xyzal and flonase.  I have called in three additional medications.  ?Take one tablet of prednisone daily for the next three days. Side effects include irritability, increased hunger and insomnia, so best to take in the morning. ?Take one tablet of montelukast each night before bed.  ?Add azelastine nasal spray to your current flonase therapy. It is safe and effective to take both concurrently. Stop if bleeding or nasal irritation occurs. ?You should start noticing an improvement to your symptoms. Should they persist >5 days, please reach back out for a re-evaluation. Monitor for fevers, shortness  of breath or wheezing. ? ?Follow Up Instructions: ?I discussed the assessment and treatment plan with the patient. The patient was provided an opportunity to ask questions and all were answered. The patient agreed with the plan and demonstrated an understanding of the instructions.  A copy of instructions were sent to the patient via MyChart unless otherwise noted below.  ? ? ? ?The patient was advised to call back or seek an in-person evaluation if the symptoms worsen or if the condition fails to improve as anticipated. ? ?Time:  ?I spent 12 minutes with the patient via telehealth technology discussing the above problems/concerns.   ? ?Kazuki Ingle L Ryin Schillo, PA  ?

## 2022-02-04 ENCOUNTER — Other Ambulatory Visit (HOSPITAL_COMMUNITY): Payer: Self-pay

## 2022-02-05 ENCOUNTER — Other Ambulatory Visit (HOSPITAL_COMMUNITY): Payer: Self-pay

## 2022-02-05 MED ORDER — OMRON 3 SERIES BP MONITOR DEVI
0 refills | Status: AC
  Filled 2022-02-05: qty 1, 1d supply, fill #0

## 2022-02-19 ENCOUNTER — Other Ambulatory Visit (HOSPITAL_COMMUNITY): Payer: Self-pay

## 2022-02-22 ENCOUNTER — Other Ambulatory Visit (HOSPITAL_COMMUNITY): Payer: Self-pay

## 2022-02-28 ENCOUNTER — Other Ambulatory Visit (HOSPITAL_COMMUNITY): Payer: Self-pay

## 2022-02-28 MED ORDER — NITROGLYCERIN 0.4 MG SL SUBL
SUBLINGUAL_TABLET | SUBLINGUAL | 0 refills | Status: DC
  Filled 2022-02-28: qty 25, 10d supply, fill #0

## 2022-02-28 MED ORDER — CIPROFLOXACIN-DEXAMETHASONE 0.3-0.1 % OT SUSP
4.0000 [drp] | Freq: Two times a day (BID) | OTIC | 0 refills | Status: DC
  Filled 2022-02-28: qty 7.5, 10d supply, fill #0

## 2022-02-28 MED ORDER — AZITHROMYCIN 250 MG PO TABS
ORAL_TABLET | ORAL | 0 refills | Status: AC
  Filled 2022-02-28: qty 6, 5d supply, fill #0

## 2022-02-28 MED ORDER — ACETIC ACID 2 % OT SOLN
5.0000 [drp] | Freq: Three times a day (TID) | OTIC | 0 refills | Status: DC
  Filled 2022-02-28: qty 15, 20d supply, fill #0

## 2022-03-07 ENCOUNTER — Emergency Department (HOSPITAL_COMMUNITY)
Admission: EM | Admit: 2022-03-07 | Discharge: 2022-03-07 | Disposition: A | Discharge status: Home / Self Care | Payer: No Typology Code available for payment source | Attending: Emergency Medicine | Admitting: Emergency Medicine

## 2022-03-07 ENCOUNTER — Emergency Department (HOSPITAL_COMMUNITY): Payer: No Typology Code available for payment source

## 2022-03-07 ENCOUNTER — Other Ambulatory Visit: Payer: Self-pay

## 2022-03-07 DIAGNOSIS — I248 Other forms of acute ischemic heart disease: Secondary | ICD-10-CM

## 2022-03-07 DIAGNOSIS — I471 Supraventricular tachycardia: Secondary | ICD-10-CM | POA: Diagnosis not present

## 2022-03-07 DIAGNOSIS — R55 Syncope and collapse: Secondary | ICD-10-CM | POA: Diagnosis not present

## 2022-03-07 DIAGNOSIS — R778 Other specified abnormalities of plasma proteins: Secondary | ICD-10-CM

## 2022-03-07 DIAGNOSIS — Z20822 Contact with and (suspected) exposure to covid-19: Secondary | ICD-10-CM | POA: Insufficient documentation

## 2022-03-07 DIAGNOSIS — R000 Tachycardia, unspecified: Secondary | ICD-10-CM | POA: Diagnosis present

## 2022-03-07 LAB — CBC WITH DIFFERENTIAL/PLATELET
Abs Immature Granulocytes: 0.04 10*3/uL (ref 0.00–0.07)
Basophils Absolute: 0 10*3/uL (ref 0.0–0.1)
Basophils Relative: 0 %
Eosinophils Absolute: 0.1 10*3/uL (ref 0.0–0.5)
Eosinophils Relative: 1 %
HCT: 37.6 % (ref 36.0–46.0)
Hemoglobin: 12.8 g/dL (ref 12.0–15.0)
Immature Granulocytes: 1 %
Lymphocytes Relative: 17 %
Lymphs Abs: 1.2 10*3/uL (ref 0.7–4.0)
MCH: 32.3 pg (ref 26.0–34.0)
MCHC: 34 g/dL (ref 30.0–36.0)
MCV: 94.9 fL (ref 80.0–100.0)
Monocytes Absolute: 0.3 10*3/uL (ref 0.1–1.0)
Monocytes Relative: 5 %
Neutro Abs: 5.4 10*3/uL (ref 1.7–7.7)
Neutrophils Relative %: 76 %
Platelets: 203 10*3/uL (ref 150–400)
RBC: 3.96 MIL/uL (ref 3.87–5.11)
RDW: 12.1 % (ref 11.5–15.5)
WBC: 7.1 10*3/uL (ref 4.0–10.5)
nRBC: 0 % (ref 0.0–0.2)

## 2022-03-07 LAB — COMPREHENSIVE METABOLIC PANEL
ALT: 21 U/L (ref 0–44)
AST: 20 U/L (ref 15–41)
Albumin: 3.3 g/dL — ABNORMAL LOW (ref 3.5–5.0)
Alkaline Phosphatase: 55 U/L (ref 38–126)
Anion gap: 6 (ref 5–15)
BUN: 11 mg/dL (ref 6–20)
CO2: 22 mmol/L (ref 22–32)
Calcium: 8.4 mg/dL — ABNORMAL LOW (ref 8.9–10.3)
Chloride: 111 mmol/L (ref 98–111)
Creatinine, Ser: 0.92 mg/dL (ref 0.44–1.00)
GFR, Estimated: >60 mL/min (ref >60)
Glucose, Bld: 93 mg/dL (ref 70–99)
Potassium: 4 mmol/L (ref 3.5–5.1)
Sodium: 139 mmol/L (ref 135–145)
Total Bilirubin: 0.9 mg/dL (ref 0.3–1.2)
Total Protein: 6.7 g/dL (ref 6.5–8.1)

## 2022-03-07 LAB — RESP PANEL BY RT-PCR (FLU A&B, COVID) ARPGX2
Influenza A by PCR: NEGATIVE
Influenza B by PCR: NEGATIVE
SARS Coronavirus 2 by RT PCR: NEGATIVE

## 2022-03-07 LAB — TROPONIN I (HIGH SENSITIVITY)
Troponin I (High Sensitivity): 337 ng/L (ref <18)
Troponin I (High Sensitivity): 251 ng/L (ref <18)

## 2022-03-07 LAB — TSH: TSH: 1.853 u[IU]/mL (ref 0.350–4.500)

## 2022-03-07 MED ORDER — ADENOSINE 6 MG/2ML IV SOLN
INTRAVENOUS | Status: AC
  Filled 2022-03-07: qty 2

## 2022-03-07 NOTE — Discharge Instructions (Signed)
Get help right away if: ?You have chest pain. ?Your symptoms get worse. ?You have trouble breathing. ?You have an episode of SVT that lasts longer than 20 minutes. ?You faint. ?

## 2022-03-07 NOTE — ED Triage Notes (Signed)
Pt to ED from work with c/o near syncope and racing heart. Pt reports symptoms came on suddenly at work about 30 mins ago and she felt like she was going to pass out. Reports symptoms worsen with standing. Patient in SVT in triage.  ?

## 2022-03-07 NOTE — ED Provider Notes (Signed)
?[facility] ?Provider Note ? ? ?CSN: [id number] ?Arrival date & time: 03/07/22  1112 ? ?  ? ?History ? ?Chief Complaint  ?Patient presents with  ? Tachycardia  ? Near Syncope  ? ? ?Julia Anderson is a 36 y.o. female. ? ?Pt reports she felt like her heart was racing and felt like she was going to pass out.  Pt reports her heart rate is still high ? ?The history is provided by the patient. No language interpreter was used.  ?Near Syncope ?This is a new problem. The problem occurs constantly. The problem has not changed since onset.Pertinent negatives include no shortness of breath. Nothing aggravates the symptoms. Nothing relieves the symptoms. She has tried nothing for the symptoms. The treatment provided no relief.  ? ?  ? ?Home Medications ?Prior to Admission medications   ?Medication Sig Start Date End Date Taking? Authorizing Provider  ?acetic acid 2 % otic solution Instill 5 drops 3 (three) times daily in each affected ear for 5 days 02/28/22     ?azelastine (ASTELIN) 0.1 % nasal spray Place 2 sprays into both nostrils in the morning and at bedtime. 01/30/22   Chaney Malling, PA  ?Blood Pressure Monitoring (OMRON 3 SERIES BP MONITOR) DEVI Use as directed 02/05/22     ?citalopram (CELEXA) 10 MG tablet Take 1 tablet (10 mg total) by mouth daily. 01/16/22     ?COVID-19 At Home Antigen Test [facility] COVID-19 HOME TEST) KIT Use as directed 01/15/22   Jefm Bryant, RPH  ?drospirenone-ethinyl estradiol (LORYNA) 3-0.02 MG tablet Take 1 tablet by mouth daily. 11/21/21     ?drospirenone-ethinyl estradiol (VESTURA) 3-0.02 MG tablet Take 1 tablet by mouth daily.    [provider]  ?fluticasone (FLONASE) 50 MCG/ACT nasal spray Place 2 sprays into both nostrils daily. 01/24/22   Perlie Mayo, NP  ?levocetirizine (XYZAL) 5 MG tablet Take 1 tablet (5 mg total) by mouth every evening. 01/24/22   Perlie Mayo, NP  ?montelukast (SINGULAIR) 10 MG tablet Take 1 tablet (10 mg  total) by mouth at bedtime. 01/30/22   Chaney Malling, PA  ?Multiple Vitamin (MULTIVITAMIN) tablet Take 1 tablet by mouth daily.    [provider]  ?nitroGLYCERIN (NITROSTAT) 0.4 MG SL tablet 1 tab(s) sublingually every 5 minutes as needed for chest pain up to three doses, if no relief by third dose, call 911 and go to ED 15 days 02/28/22     ?   ? ?Allergies    ?Phentermine, Fluvoxamine, Iodinated contrast media, Iohexol, Other, and Sertraline   ? ?Review of Systems   ?Review of Systems  ?Respiratory:  Negative for shortness of breath.   ?Cardiovascular:  Positive for near-syncope.  ?All other systems reviewed and are negative. ? ?Physical Exam ?Updated Vital Signs ?BP (!) 117/95   Pulse 86   Temp 98 ?F (36.7 ?C)   Resp 15   Ht _0  (1.753 m)   Wt 107.5 kg   SpO2 97%   BMI 35.00 kg/m?  ?Physical Exam ?Vitals and nursing note reviewed.  ?Constitutional:   ?   Appearance: She is well-developed.  ?HENT:  ?   Head: Normocephalic.  ?   Nose: Nose normal.  ?   Mouth/Throat:  ?   Mouth: Mucous membranes are moist.  ?Cardiovascular:  ?   Rate and Rhythm: Tachycardia present.  ?   Pulses: Normal pulses.  ?Pulmonary:  ?   Effort: Pulmonary effort is normal.  ?  Abdominal:  ?   General: Abdomen is flat. There is no distension.  ?Musculoskeletal:     ?   General: Normal range of motion.  ?   Cervical back: Normal range of motion.  ?Skin: ?   General: Skin is warm.  ?Neurological:  ?   General: No focal deficit present.  ?   Mental Status: She is alert and oriented to person, place, and time.  ? ? ?ED Results / Procedures / Treatments   ?Labs ?(all labs ordered are listed, but only abnormal results are displayed) ?Labs Reviewed  ?COMPREHENSIVE METABOLIC PANEL - Abnormal; Notable for the following components:  ?    Result Value  ? Calcium 8.4 (*)   ? Albumin 3.3 (*)   ? All other components within normal limits  ?TROPONIN I (HIGH SENSITIVITY) - Abnormal; Notable for the following components:  ? Troponin I (High  Sensitivity) 251 (*)   ? All other components within normal limits  ?RESP PANEL BY RT-PCR (FLU A&B, COVID) ARPGX2  ?CBC WITH DIFFERENTIAL/PLATELET  ?TSH  ?TROPONIN I (HIGH SENSITIVITY)  ? ? ?EKG ?None ? ?Radiology ?DG Chest Port 1 View ? ?Result Date: 03/07/2022 ?CLINICAL DATA:  Cough. EXAM: PORTABLE CHEST 1 VIEW COMPARISON:  None Available. FINDINGS: No consolidation. No visible pleural effusions or pneumothorax. Cardiomediastinal silhouette is within normal limits. No displaced fracture. IMPRESSION: No evidence of acute cardiopulmonary disease. Electronically Signed   By: Margaretha Sheffield M.D.   On: 03/07/2022 14:18   ? ?Procedures ?Marland KitchenCritical Care E&M ?Performed by: Fransico Meadow, PA-C ? ?Critical care provider statement:  ?  Critical care time (minutes):  30 ?  Critical care start time:  03/07/2022 1:00 PM ?  Critical care end time:  03/07/2022 3:57 PM ?  Critical care was necessary to treat or prevent imminent or life-threatening deterioration of the following conditions:  Circulatory failure and cardiac failure ?  Critical care was time spent personally by me on the following activities:  Ordering and review of laboratory studies, ordering and review of radiographic studies, examination of patient, interpretation of cardiac output measurements and re-evaluation of patient's condition ?  I assumed direction of critical care for this patient from another provider in my specialty: no   ?After initial E/M assessment, critical care services were subsequently performed that were exclusive of separately billable procedures or treatment.  ?  ? ? ?Medications Ordered in ED ?Medications  ?adenosine (ADENOCARD) 6 MG/2ML injection (0 mg  Hold 03/07/22 1227)  ? ? ?ED Course/ Medical Decision Making/ A&P ?Clinical Course as of 03/07/22 1548  ?Thu Mar 07, 2022  ?1521 Pt with svt 200s, initital trop elevated. Will repeat ? [AH]  ?  ?Clinical Course User Index ?[AH] Margarita Mail, PA-C  ? ?                        ?Medical  Decision Making ?Pt complains of her heart racing.  Pt has taken sudafed today  ? ?Amount and/or Complexity of Data Reviewed ?Labs: ordered. Decision-making details documented in ED Course. ?Radiology: ordered. ? ?Critical Care ?Total time providing critical care: 30 minutes ? ?Pt able to convert to Normal sinus with valsalva maneuver.  Pt has remained in normal sinus. ?Pt's care turned over to Doreen Salvage at [facility] and covid pending   ? ? ? ? ? ? ? ?Final Clinical Impression(s) / ED Diagnoses ?Final diagnoses:  ?SVT (supraventricular tachycardia) ([place])  ? ? ?Rx / DC  Orders ?ED Discharge Orders   ? ? None  ? ?  ? ? ?  ?Fransico Meadow, [place] ?03/07/22 1558 ? ?  ?Lorelle Gibbs, DO ?03/07/22 1617 ? ?

## 2022-03-08 ENCOUNTER — Other Ambulatory Visit (HOSPITAL_COMMUNITY): Payer: Self-pay

## 2022-03-14 DIAGNOSIS — I471 Supraventricular tachycardia, unspecified: Secondary | ICD-10-CM | POA: Insufficient documentation

## 2022-03-14 NOTE — Progress Notes (Signed)
Patient referred by Norm Salt, PA for palpitations  Subjective:   [name], female    DOB: 22-Mar-1986, 36 y.o.   MRN: [id number]   Chief Complaint  Patient presents with   Palpitations     HPI  36 y.o. Caucasian female with cerebral palsy, referred for evaluation of palpitations  Patient is here with her boyfriend. She works as a Pharmacologist at [employer]. She walks 2-2/5 miles at baseline without any complaints of chest pain, shortness of breath. She drinks about 1 cup of coffee a day, seldom drinks alcohol. Recently, she has been on pseudofed and other allergy medications.   Patient was seen in [facility] ER on 03/07/2022 with complaints of palpitations. Episode lasted for about 30 min, was associated with lightheadedness, but no chest pain or shortness of breath. She was found to have SVT at 210 bpm that converted to sinus rhythm with Valsalva maneuver. HS trop was elevated. Patient has no had any recurrence of similar symptoms since then.    Past Medical History:  Diagnosis Date   Allergy    Anxiety    Asthma    Cerebral palsy (HCC)    Migraine      Past Surgical History:  Procedure Laterality Date   FOOT CAPSULE RELEASE W/ PERCUTANEOUS HEEL CORD LENGTHENING, TIBIAL TENDON TRANSFER     lengthen a tight heel cord left leg     Social History   Tobacco Use  Smoking Status Never  Smokeless Tobacco Never    Social History   Substance and Sexual Activity  Alcohol Use Yes   Alcohol/week: 1.0 standard drink   Types: 1 Glasses of wine per week   Comment: rare     Family History  Problem Relation Age of Onset   Suicidality Mother    Cirrhosis Father    Cancer Paternal Aunt        breast   Diabetes Paternal Aunt    Diabetes Paternal Grandfather    Cancer Paternal Grandfather        prostate   Alcohol abuse Mother    Drug abuse Mother    Depression Mother    Anxiety disorder Mother       Current Outpatient  Medications:    acetic acid 2 % otic solution, Instill 5 drops 3 (three) times daily in each affected ear for 5 days, Disp: 15 mL, Rfl: 0   azelastine (ASTELIN) 0.1 % nasal spray, Place 2 sprays into both nostrils in the morning and at bedtime., Disp: 30 mL, Rfl: 0   Blood Pressure Monitoring (OMRON 3 SERIES BP MONITOR) DEVI, Use as directed, Disp: 1 each, Rfl: 0   citalopram (CELEXA) 10 MG tablet, Take 1 tablet (10 mg total) by mouth daily., Disp: 30 tablet, Rfl: 0   COVID-19 At Home Antigen Test ([facility] COVID-19 HOME TEST) KIT, Use as directed, Disp: 8 each, Rfl: 0   drospirenone-ethinyl estradiol (LORYNA) 3-0.02 MG tablet, Take 1 tablet by mouth daily., Disp: 28 tablet, Rfl: 10   drospirenone-ethinyl estradiol (VESTURA) 3-0.02 MG tablet, Take 1 tablet by mouth daily., Disp: , Rfl:    fluticasone (FLONASE) 50 MCG/ACT nasal spray, Place 2 sprays into both nostrils daily., Disp: 16 g, Rfl: 0   levocetirizine (XYZAL) 5 MG tablet, Take 1 tablet (5 mg total) by mouth every evening., Disp: 30 tablet, Rfl: 0   montelukast (SINGULAIR) 10 MG tablet, Take 1 tablet (10 mg total) by mouth at bedtime., Disp: 30 tablet, Rfl:  0   Multiple Vitamin (MULTIVITAMIN) tablet, Take 1 tablet by mouth daily., Disp: , Rfl:    nitroGLYCERIN (NITROSTAT) 0.4 MG SL tablet, 1 tab(s) sublingually every 5 minutes as needed for chest pain up to three doses, if no relief by third dose, call 911 and go to ED 15 days, Disp: 45 tablet, Rfl: 0   Cardiovascular and other pertinent studies:  Reviewed external labs and tests, independently interpreted  EKG 03/15/2022: Sinus rhythm 75 bpm RSR(V1) -nondiagnostic  Nonspecific T-abnormality  EKG 03/07/2022 16:10 SInus rhythm 84 bpm Rightward axis  EKG 03/07/2022 11:18 Supraventricular tachycardia Possible Right ventricular hypertrophy ST & T wave abnormality, consider inferior ischemia ST & T wave abnormality,   Recent labs: 03/07/2022: Glucose 93, BUN/Cr 11/0.92. EGFR >60.  Na/K 139/4.0. Rest of the CMP normal H/H 12/37. MCV 94. Platelets 203 HbA1C NA Lipid panel NA TSH 1.8 normal   Latest Reference Range & Units 03/07/22 11:12 03/07/22 14:18  Troponin I (High Sensitivity) <18 ng/L 337 (HH) 251 (HH)  (HH): Data is critically high    Review of Systems  Cardiovascular:  Positive for palpitations. Negative for chest pain, dyspnea on exertion, leg swelling and syncope.  Neurological:  Positive for light-headedness.        Vitals:   03/15/22 0845  BP: 128/85  Pulse: 78  Resp: 17  Temp: 97.6 F (36.4 C)  SpO2: 99%     Body mass index is 34.44 kg/m. Filed Weights   03/15/22 0845  Weight: 233 lb 3.2 oz (105.8 kg)     Objective:   Physical Exam Vitals and nursing note reviewed.  Constitutional:      General: She is not in acute distress. Neck:     Vascular: No JVD.  Cardiovascular:     Rate and Rhythm: Normal rate and regular rhythm.     Heart sounds: Normal heart sounds. No murmur heard. Pulmonary:     Effort: Pulmonary effort is normal.     Breath sounds: Normal breath sounds. No wheezing or rales.  Musculoskeletal:     Right lower leg: No edema.     Left lower leg: No edema.         Visit diagnoses:   ICD-10-CM   1. PSVT (paroxysmal supraventricular tachycardia) (HCC)  I47.1 EKG 12-Lead    diltiazem (CARDIZEM) 30 MG tablet    PCV CARDIAC STRESS TEST    PCV ECHOCARDIOGRAM COMPLETE    CT CARDIAC SCORING ([facility] LOCATIONS ONLY)    2. Abnormal EKG  R94.31 PCV CARDIAC STRESS TEST    PCV ECHOCARDIOGRAM COMPLETE       Orders Placed This Encounter  Procedures   CT CARDIAC SCORING ([facility] LOCATIONS ONLY)   PCV CARDIAC STRESS TEST   EKG 12-Lead   PCV ECHOCARDIOGRAM COMPLETE     Meds ordered this encounter  Medications   diltiazem (CARDIZEM) 30 MG tablet    Sig: Take 1 tablet (30 mg total) by mouth 3 (three) times daily as needed.    Dispense:  60 tablet    Refill:  3     Assessment & Recommendations:    36 y.o.  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 Episode resolved with vagal maneuver. Recommend using the same for future episode.  In addition, we discussed as needed diltiazem 30 mg, vs metoprolol tartrate 25 mg bid, vs referral for ablation. Patient prefers as needed diltiazem for now.  Will check echocardiogram, Consider using Apple watch for  daily monitoring.  Troponin elevation: Likely secondary to the above. Will check exercise treadmill stress test and calcium score scan  Further recommendation after above testing   Thank you for referring the patient to [place]. Please feel free to contact with any questions.   Nigel Mormon, MD Pager: [phone] Office: [phone]

## 2022-03-15 ENCOUNTER — Encounter: Payer: Self-pay | Admitting: Cardiology

## 2022-03-15 ENCOUNTER — Ambulatory Visit: Payer: Self-pay | Admitting: Cardiology

## 2022-03-15 VITALS — BP 128/85 | HR 78 | Temp 97.6°F | Resp 17 | Ht 69.0 in | Wt 233.2 lb

## 2022-03-15 DIAGNOSIS — R9431 Abnormal electrocardiogram [ECG] [EKG]: Secondary | ICD-10-CM | POA: Insufficient documentation

## 2022-03-15 DIAGNOSIS — I471 Supraventricular tachycardia: Secondary | ICD-10-CM

## 2022-03-15 MED ORDER — DILTIAZEM HCL 30 MG PO TABS: 30.0000 mg | ORAL_TABLET | Freq: Three times a day (TID) | ORAL | 3 refills | Status: DC | PRN

## 2022-03-22 ENCOUNTER — Ambulatory Visit: Payer: Self-pay

## 2022-03-22 DIAGNOSIS — R9431 Abnormal electrocardiogram [ECG] [EKG]: Secondary | ICD-10-CM

## 2022-03-22 DIAGNOSIS — I471 Supraventricular tachycardia: Secondary | ICD-10-CM

## 2022-04-05 ENCOUNTER — Ambulatory Visit: Payer: Self-pay

## 2022-04-05 DIAGNOSIS — R9431 Abnormal electrocardiogram [ECG] [EKG]: Secondary | ICD-10-CM

## 2022-04-05 DIAGNOSIS — I471 Supraventricular tachycardia: Secondary | ICD-10-CM

## 2022-04-17 ENCOUNTER — Encounter: Payer: Self-pay | Admitting: Cardiology

## 2022-04-17 ENCOUNTER — Telehealth: Payer: Self-pay

## 2022-04-17 NOTE — Telephone Encounter (Signed)
Patient called and stated that she needs a cortisone shot with [facility], Dr. Susa Anderson wants to give her a cortisone injection and needs medical clearance to do so. Will patient need an earlier appointment than 7/19? Please advise.

## 2022-04-17 NOTE — Telephone Encounter (Signed)
Patient aware.

## 2022-04-17 NOTE — Telephone Encounter (Signed)
Okay to proceed. I can send a letter. No need to make appt sooner.   Thanks MJP

## 2022-04-19 ENCOUNTER — Other Ambulatory Visit (HOSPITAL_COMMUNITY): Payer: Self-pay

## 2022-04-26 ENCOUNTER — Other Ambulatory Visit: Payer: No Typology Code available for payment source

## 2022-05-09 ENCOUNTER — Other Ambulatory Visit (HOSPITAL_COMMUNITY): Payer: Self-pay

## 2022-05-13 ENCOUNTER — Other Ambulatory Visit (HOSPITAL_COMMUNITY): Payer: Self-pay

## 2022-05-15 ENCOUNTER — Encounter: Payer: Self-pay | Admitting: Cardiology

## 2022-05-15 ENCOUNTER — Ambulatory Visit: Payer: Self-pay | Admitting: Cardiology

## 2022-05-15 VITALS — BP 132/93 | HR 85 | Temp 97.8°F | Resp 16 | Ht 69.0 in | Wt 230.0 lb

## 2022-05-15 DIAGNOSIS — R030 Elevated blood-pressure reading, without diagnosis of hypertension: Secondary | ICD-10-CM

## 2022-05-15 DIAGNOSIS — I471 Supraventricular tachycardia: Secondary | ICD-10-CM

## 2022-05-15 NOTE — Progress Notes (Signed)
Patient referred by Trey Sailors, PA for palpitations  Subjective:   Julia Anderson, female    DOB: [birth year], 36 y.o.   MRN: [id number]   Chief Complaint  Patient presents with   PSVT (paroxysmal supraventricular tachycardia)   Follow-up    2 month     HPI  36 y.o. Caucasian female with cerebral palsy, PSVT  Patient has had no recurrence of palpitation symptoms since her last visit with me.  She has noticed increasing blood pressure readings at times.  She does endorse eating frozen meals couple times a week.  Currently, she checks blood pressure at home occasionally.   Current Outpatient Medications:    azelastine (ASTELIN) 0.1 % nasal spray, Place 2 sprays into both nostrils in the morning and at bedtime., Disp: 30 mL, Rfl: 0   Blood Pressure Monitoring (OMRON 3 SERIES BP MONITOR) DEVI, Use as directed, Disp: 1 each, Rfl: 0   cholecalciferol (VITAMIN D3) 25 MCG (1000 UNIT) tablet, Take 1,000 Units by mouth daily., Disp: , Rfl:    diltiazem (CARDIZEM) 30 MG tablet, Take 1 tablet (30 mg total) by mouth 3 (three) times daily as needed., Disp: 60 tablet, Rfl: 3   drospirenone-ethinyl estradiol (LORYNA) 3-0.02 MG tablet, Take 1 tablet by mouth daily., Disp: 28 tablet, Rfl: 10   fluticasone (FLONASE) 50 MCG/ACT nasal spray, Place 2 sprays into both nostrils daily., Disp: 16 g, Rfl: 0   levocetirizine (XYZAL) 5 MG tablet, Take 1 tablet (5 mg total) by mouth every evening. (Patient taking differently: Take 5 mg by mouth as needed.), Disp: 30 tablet, Rfl: 0   montelukast (SINGULAIR) 10 MG tablet, Take 1 tablet (10 mg total) by mouth at bedtime., Disp: 30 tablet, Rfl: 0   Multiple Vitamin (MULTIVITAMIN) tablet, Take 1 tablet by mouth daily., Disp: , Rfl:    nitroGLYCERIN (NITROSTAT) 0.4 MG SL tablet, 1 tab(s) sublingually every 5 minutes as needed for chest pain up to three doses, if no relief by third dose, call 911 and go to ED 15 days, Disp: 45 tablet, Rfl: 0   VITAMIN E  PO, Take by mouth., Disp: , Rfl:    Cardiovascular and other pertinent studies:  Exercise treadmill stress test 04/05/2022: Exercise treadmill stress test performed using Bruce protocol.  Patient reached 6.3 METS, and 91% of age predicted maximum heart rate.  Exercise capacity was low.  No chest pain reported.  Normal heart rate and hemodynamic response. Stress EKG revealed no ischemic changes. Low risk study.  Echocardiogram 03/22/2022:  Left ventricle cavity is normal in size and wall thickness. Normal global  wall motion. Normal LV systolic function with EF 55%. Normal diastolic  filling pattern.  No significant valvular abnormality.  No evidence of pulmonary hypertension.    Reviewed external labs and tests, independently interpreted  EKG 03/15/2022: Sinus rhythm 75 bpm RSR(V1) -nondiagnostic  Nonspecific T-abnormality  EKG 03/07/2022 16:10 SInus rhythm 84 bpm Rightward axis  EKG 03/07/2022 11:18 Supraventricular tachycardia Possible Right ventricular hypertrophy ST & T wave abnormality, consider inferior ischemia ST & T wave abnormality,   Recent labs: 03/07/2022: Glucose 93, BUN/Cr 11/0.92. EGFR >60. Na/K 139/4.0. Rest of the CMP normal H/H 12/37. MCV 94. Platelets 203 HbA1C NA Lipid panel NA TSH 1.8 normal   Latest Reference Range & Units 03/07/22 11:12 03/07/22 14:18  Troponin I (High Sensitivity) <18 ng/L 337 (HH) 251 (HH)  (HH): Data is critically high    Review of Systems  Cardiovascular:  Positive for palpitations.  Negative for chest pain, dyspnea on exertion, leg swelling and syncope.  Neurological:  Positive for light-headedness.         Vitals:   05/15/22 0932 05/15/22 0933  BP: 140/89 (!) 132/93  Pulse: 84 85  Resp: 16   Temp: 97.8 F (36.6 C)   SpO2: 99%      Body mass index is 33.97 kg/m. Filed Weights   05/15/22 0932  Weight: 230 lb (104.3 kg)     Objective:   Physical Exam Vitals and nursing note reviewed.  Constitutional:       General: She is not in acute distress. Neck:     Vascular: No JVD.  Cardiovascular:     Rate and Rhythm: Normal rate and regular rhythm.     Heart sounds: Normal heart sounds. No murmur heard. Pulmonary:     Effort: Pulmonary effort is normal.     Breath sounds: Normal breath sounds. No wheezing or rales.  Musculoskeletal:     Right lower leg: No edema.     Left lower leg: No edema.          Visit diagnoses:   ICD-10-CM   1. PSVT (paroxysmal supraventricular tachycardia) (HCC)  I47.1     2. Elevated blood pressure reading in office with white coat syndrome, without diagnosis of hypertension  R03.0         Assessment & Recommendations:    36 y.o. Caucasian female with cerebral palsy, PSVT, elevated blood pressure without diagnosis of hypertension  PSVT: No recurrent PSVT symptoms at this time.  Continue vagal maneuvers, and diltiazem as needed. Avoid large amounts of caffeine or alcohol intake.  Troponin elevation: Occurred in 02/2022, in the setting of PSVT episode at 209 bpm.  No ischemia on exercise treadmill EKG.  CT cardiac scoring pending, but can hold for now.  Elevated blood pressure without diagnosis of hypertension: Discussed low-salt diet.  Patient will keep track of her home blood pressure readings and communicate with me on MyChart.  If remains elevated, could add amlodipine.  F/u in  58month    MNigel Mormon MD Pager: [phone]: [phone]

## 2022-08-22 ENCOUNTER — Other Ambulatory Visit (HOSPITAL_BASED_OUTPATIENT_CLINIC_OR_DEPARTMENT_OTHER): Payer: Self-pay

## 2022-08-27 ENCOUNTER — Other Ambulatory Visit (HOSPITAL_BASED_OUTPATIENT_CLINIC_OR_DEPARTMENT_OTHER): Payer: Self-pay

## 2022-08-29 ENCOUNTER — Other Ambulatory Visit (HOSPITAL_BASED_OUTPATIENT_CLINIC_OR_DEPARTMENT_OTHER): Payer: Self-pay

## 2022-09-10 ENCOUNTER — Other Ambulatory Visit: Payer: Self-pay | Admitting: Orthopaedic Surgery

## 2022-09-11 ENCOUNTER — Other Ambulatory Visit (HOSPITAL_BASED_OUTPATIENT_CLINIC_OR_DEPARTMENT_OTHER): Payer: Self-pay

## 2022-10-17 ENCOUNTER — Other Ambulatory Visit: Payer: Self-pay | Admitting: Orthopaedic Surgery

## 2022-10-17 ENCOUNTER — Ambulatory Visit: Admit: 2022-10-17 | Payer: No Typology Code available for payment source | Admitting: Orthopaedic Surgery

## 2022-10-19 ENCOUNTER — Emergency Department (HOSPITAL_BASED_OUTPATIENT_CLINIC_OR_DEPARTMENT_OTHER): Payer: Commercial Managed Care - PPO

## 2022-10-19 ENCOUNTER — Other Ambulatory Visit: Payer: Self-pay

## 2022-10-19 ENCOUNTER — Encounter (HOSPITAL_BASED_OUTPATIENT_CLINIC_OR_DEPARTMENT_OTHER): Payer: Self-pay | Admitting: Emergency Medicine

## 2022-10-19 ENCOUNTER — Emergency Department (HOSPITAL_BASED_OUTPATIENT_CLINIC_OR_DEPARTMENT_OTHER)
Admission: EM | Admit: 2022-10-19 | Discharge: 2022-10-19 | Disposition: A | Discharge status: Home / Self Care | Payer: Commercial Managed Care - PPO | Attending: Emergency Medicine | Admitting: Emergency Medicine

## 2022-10-19 DIAGNOSIS — R5383 Other fatigue: Secondary | ICD-10-CM | POA: Diagnosis present

## 2022-10-19 DIAGNOSIS — R002 Palpitations: Secondary | ICD-10-CM | POA: Diagnosis not present

## 2022-10-19 LAB — BASIC METABOLIC PANEL
Anion gap: 8 (ref 5–15)
BUN: 10 mg/dL (ref 6–20)
CO2: 23 mmol/L (ref 22–32)
Calcium: 8.8 mg/dL — ABNORMAL LOW (ref 8.9–10.3)
Chloride: 106 mmol/L (ref 98–111)
Creatinine, Ser: 0.67 mg/dL (ref 0.44–1.00)
GFR, Estimated: >60 mL/min (ref >60)
Glucose, Bld: 100 mg/dL — ABNORMAL HIGH (ref 70–99)
Potassium: 4 mmol/L (ref 3.5–5.1)
Sodium: 137 mmol/L (ref 135–145)

## 2022-10-19 LAB — CBC
HCT: 43.5 % (ref 36.0–46.0)
Hemoglobin: 14.9 g/dL (ref 12.0–15.0)
MCH: 32.1 pg (ref 26.0–34.0)
MCHC: 34.3 g/dL (ref 30.0–36.0)
MCV: 93.8 fL (ref 80.0–100.0)
Platelets: 218 10*3/uL (ref 150–400)
RBC: 4.64 MIL/uL (ref 3.87–5.11)
RDW: 12 % (ref 11.5–15.5)
WBC: 4.4 10*3/uL (ref 4.0–10.5)
nRBC: 0 % (ref 0.0–0.2)

## 2022-10-19 LAB — TROPONIN I (HIGH SENSITIVITY)
Troponin I (High Sensitivity): 4 ng/L (ref <18)
Troponin I (High Sensitivity): 3 ng/L (ref <18)

## 2022-10-19 NOTE — ED Triage Notes (Signed)
Patient presents to ED via POV from home. Here with fatigue. Patient reports "I went into SVT last night". Denies palpitations at this time.

## 2022-10-19 NOTE — Discharge Instructions (Addendum)
You were evaluated today for palpitations which based on your history could have been an episode of SVT.  Your workup today was reassuring for no signs of any acute abnormalities at this time.  Please schedule a follow-up appointment with cardiology for reevaluation.  If you develop any life-threatening condition such as repeat episodes of SVT or chest pain please return to the emergency department for reevaluation.

## 2022-10-19 NOTE — ED Provider Notes (Signed)
[facility] EMERGENCY DEPARTMENT Provider Note   CSN: [id number] Arrival date & time: 10/19/22  1210     History  Chief Complaint  Patient presents with   Fatigue    Julia Anderson is a 36 y.o. female.  Patient presents the emergency room with a chief complaint of an episode of SVT.  Patient states she was diagnosed with SVT in May of this year and was followed by cardiology.  She states she has had no further episodes of SVT since that time.  She has a last night she was sitting in bed when she suddenly felt that her heart was fluttering.  She had her Apple Watch on which recorded a heart rate of 210 bpm.  She states she was able to do some vagal maneuvers at home which converted her SVT.  She is complaining of no chest pain, shortness of breath, abdominal pain, nausea, vomiting, weakness.  The patient has past medical history significant for cerebral palsy, allergies, asthma, anxiety  HPI     Home Medications Prior to Admission medications   Medication Sig Start Date End Date Taking? Authorizing Provider  busPIRone (BUSPAR) 7.5 MG tablet Take 7.5 mg by mouth 3 (three) times daily.   Yes [provider]  azelastine (ASTELIN) 0.1 % nasal spray Place 2 sprays into both nostrils in the morning and at bedtime. 01/30/22   Guy Sandifer L, PA  Blood Pressure Monitoring (OMRON 3 SERIES BP MONITOR) DEVI Use as directed 02/05/22     cholecalciferol (VITAMIN D3) 25 MCG (1000 UNIT) tablet Take 1,000 Units by mouth daily.    [provider]  diltiazem (CARDIZEM) 30 MG tablet Take 1 tablet (30 mg total) by mouth 3 (three) times daily as needed. 03/15/22   Patwardhan, Anabel Bene, MD  drospirenone-ethinyl estradiol (LORYNA) 3-0.02 MG tablet Take 1 tablet by mouth daily. 11/21/21     fluticasone (FLONASE) 50 MCG/ACT nasal spray Place 2 sprays into both nostrils daily. 01/24/22   Freddy Finner, NP  levocetirizine (XYZAL) 5 MG tablet Take 1 tablet (5 mg total) by mouth every  evening. Patient taking differently: Take 5 mg by mouth as needed. 01/24/22   Freddy Finner, NP  montelukast (SINGULAIR) 10 MG tablet Take 1 tablet (10 mg total) by mouth at bedtime. 01/30/22   Crain, Alphonzo Lemmings L, PA  Multiple Vitamin (MULTIVITAMIN) tablet Take 1 tablet by mouth daily.    [provider]  nitroGLYCERIN (NITROSTAT) 0.4 MG SL tablet 1 tab(s) sublingually every 5 minutes as needed for chest pain up to three doses, if no relief by third dose, call 911 and go to ED 15 days 02/28/22     VITAMIN E PO Take by mouth.    [provider]      Allergies    Phentermine, Fluvoxamine, Iodinated contrast media, Iohexol, Other, and Sertraline    Review of Systems   Review of Systems  Cardiovascular:  Positive for palpitations (Resolved).    Physical Exam Updated Vital Signs BP 127/80   Pulse 75   Temp 98.8 F (37.1 C) (Oral)   Resp 17   SpO2 100%  Physical Exam Vitals and nursing note reviewed.  Constitutional:      General: She is not in acute distress.    Appearance: She is well-developed.  HENT:     Head: Normocephalic and atraumatic.  Eyes:     Conjunctiva/sclera: Conjunctivae normal.  Cardiovascular:     Rate and Rhythm: Normal rate and regular  rhythm.     Heart sounds: No murmur heard. Pulmonary:     Effort: Pulmonary effort is normal. No respiratory distress.     Breath sounds: Normal breath sounds.  Abdominal:     Palpations: Abdomen is soft.     Tenderness: There is no abdominal tenderness.  Musculoskeletal:        General: No swelling.     Cervical back: Neck supple.  Skin:    General: Skin is warm and dry.     Capillary Refill: Capillary refill takes less than 2 seconds.  Neurological:     Mental Status: She is alert.  Psychiatric:        Mood and Affect: Mood normal.     ED Results / Procedures / Treatments   Labs (all labs ordered are listed, but only abnormal results are displayed) Labs Reviewed  BASIC METABOLIC PANEL - Abnormal;  Notable for the following components:      Result Value   Glucose, Bld 100 (*)    Calcium 8.8 (*)    All other components within normal limits  CBC  TROPONIN I (HIGH SENSITIVITY)  TROPONIN I (HIGH SENSITIVITY)    EKG EKG Interpretation  Date/Time:  Saturday October 19 2022 13:43:59 EST Ventricular Rate:  78 PR Interval:  152 QRS Duration: 103 QT Interval:  365 QTC Calculation: 416 R Axis:   91 Text Interpretation: Sinus rhythm Borderline right axis deviation Low voltage, precordial leads Confirmed by Vanetta Mulders [phone]) on 10/19/2022 1:48:49 PM  Radiology DG Chest Port 1 View  Result Date: 10/19/2022 CLINICAL DATA:  Chest discomfort.  Palpitations.  Fatigue. EXAM: PORTABLE CHEST 1 VIEW COMPARISON:  03/07/2022. FINDINGS: Normal heart, mediastinum and hila. Clear lungs.  No pleural effusion.  No pneumothorax. Skeletal structures are grossly intact. IMPRESSION: No active disease. Electronically Signed   By: Amie Portland M.D.   On: 10/19/2022 13:29    Procedures Procedures    Medications Ordered in ED Medications - No data to display  ED Course/ Medical Decision Making/ A&P                           Medical Decision Making Amount and/or Complexity of Data Reviewed Labs: ordered. Radiology: ordered.   Patient presents with a chief complaint of episode of tachycardia.  Differential diagnosis includes but is not limited to SVT, A-fib with RVR, ACS, PE, and others  I reviewed the patient's past medical history and found cardiology notes from July of this year where she had been evaluated and followed for an episode of SVT  I ordered and reviewed labs.  Troponin 3, BMP and CBC unremarkable  I ordered and interpreted imaging including a chest x-ray which showed no active disease.  I agree with radiologist findings  I reviewed the EKG which showed sinus rhythm  The patient is having no symptoms at this time.  Workup is benign with no signs of ACS.  No signs of  electrolyte abnormality.  Heart rate has been in the normal range throughout the encounter.  Plan to discharge patient at this time.  I recommend follow-up with cardiology for further evaluation and management.  Patient given return precautions including repeat SVT and chest pain        Final Clinical Impression(s) / ED Diagnoses Final diagnoses:  Palpitations    Rx / DC Orders ED Discharge Orders     None         Darrick Grinder,  PA-C 10/19/22 1634    Vanetta Mulders, MD 10/21/22 [phone]

## 2022-10-28 DIAGNOSIS — I471 Supraventricular tachycardia, unspecified: Secondary | ICD-10-CM

## 2022-10-28 HISTORY — DX: Supraventricular tachycardia, unspecified: I47.10

## 2022-11-03 ENCOUNTER — Encounter (HOSPITAL_BASED_OUTPATIENT_CLINIC_OR_DEPARTMENT_OTHER): Payer: Self-pay

## 2022-11-03 ENCOUNTER — Other Ambulatory Visit: Payer: Self-pay

## 2022-11-03 DIAGNOSIS — R002 Palpitations: Secondary | ICD-10-CM | POA: Insufficient documentation

## 2022-11-03 NOTE — ED Triage Notes (Signed)
Pt reports she felt her heart racing tonight around 2315, lasting about 20 minutes. She states her apple watch told her that her HR increased to 210. She has hx of SVT. HR currently 103 sinus tach. Denies chest pain.

## 2022-11-03 NOTE — ED Notes (Signed)
The patient now reports central chest pain that just now started at this moment.

## 2022-11-04 ENCOUNTER — Emergency Department (HOSPITAL_BASED_OUTPATIENT_CLINIC_OR_DEPARTMENT_OTHER)
Admission: EM | Admit: 2022-11-04 | Discharge: 2022-11-04 | Disposition: A | Discharge status: Home / Self Care | Payer: Commercial Managed Care - PPO | Attending: Emergency Medicine | Admitting: Emergency Medicine

## 2022-11-04 ENCOUNTER — Emergency Department (HOSPITAL_BASED_OUTPATIENT_CLINIC_OR_DEPARTMENT_OTHER): Payer: Commercial Managed Care - PPO

## 2022-11-04 DIAGNOSIS — R002 Palpitations: Secondary | ICD-10-CM

## 2022-11-04 DIAGNOSIS — Z8679 Personal history of other diseases of the circulatory system: Secondary | ICD-10-CM

## 2022-11-04 LAB — CBC
HCT: 44.8 % (ref 36.0–46.0)
Hemoglobin: 15 g/dL (ref 12.0–15.0)
MCH: 31.4 pg (ref 26.0–34.0)
MCHC: 33.5 g/dL (ref 30.0–36.0)
MCV: 93.9 fL (ref 80.0–100.0)
Platelets: 222 10*3/uL (ref 150–400)
RBC: 4.77 MIL/uL (ref 3.87–5.11)
RDW: 12.3 % (ref 11.5–15.5)
WBC: 5.3 10*3/uL (ref 4.0–10.5)
nRBC: 0 % (ref 0.0–0.2)

## 2022-11-04 LAB — BASIC METABOLIC PANEL
Anion gap: 11 (ref 5–15)
BUN: 21 mg/dL — ABNORMAL HIGH (ref 6–20)
CO2: 23 mmol/L (ref 22–32)
Calcium: 8.5 mg/dL — ABNORMAL LOW (ref 8.9–10.3)
Chloride: 103 mmol/L (ref 98–111)
Creatinine, Ser: 1.01 mg/dL — ABNORMAL HIGH (ref 0.44–1.00)
GFR, Estimated: >60 mL/min (ref >60)
Glucose, Bld: 106 mg/dL — ABNORMAL HIGH (ref 70–99)
Potassium: 3.8 mmol/L (ref 3.5–5.1)
Sodium: 137 mmol/L (ref 135–145)

## 2022-11-04 LAB — TROPONIN I (HIGH SENSITIVITY): Troponin I (High Sensitivity): <2 ng/L (ref <18)

## 2022-11-04 NOTE — ED Provider Notes (Signed)
[facility] EMERGENCY DEPARTMENT Provider Note   CSN: [id number] Arrival date & time: 11/03/22  2342     History  Chief Complaint  Patient presents with   Palpitations    Julia Anderson is a 37 y.o. female.  Patient is a 37 year old female presenting with complaints of palpitations.  She has history of SVT diagnosed in May of this year.  She was sitting at home when she began to feel weak and lightheaded.  She put on her Apple Watch and was told her heart rate was 210.  She then performed vagal maneuvers, then was able to return to a normal rhythm.  She describes some chest tightness.  She denies any excessive caffeine intake or alcohol intake.  She does report increased stress at work and having some difficulty sleeping at night.  The history is provided by the patient.       Home Medications Prior to Admission medications   Medication Sig Start Date End Date Taking? Authorizing Provider  azelastine (ASTELIN) 0.1 % nasal spray Place 2 sprays into both nostrils in the morning and at bedtime. 01/30/22   Guy Sandifer L, PA  Blood Pressure Monitoring (OMRON 3 SERIES BP MONITOR) DEVI Use as directed 02/05/22     busPIRone (BUSPAR) 7.5 MG tablet Take 7.5 mg by mouth 3 (three) times daily.    [provider]  cholecalciferol (VITAMIN D3) 25 MCG (1000 UNIT) tablet Take 1,000 Units by mouth daily.    [provider]  diltiazem (CARDIZEM) 30 MG tablet Take 1 tablet (30 mg total) by mouth 3 (three) times daily as needed. 03/15/22   Patwardhan, Anabel Bene, MD  drospirenone-ethinyl estradiol (LORYNA) 3-0.02 MG tablet Take 1 tablet by mouth daily. 11/21/21     fluticasone (FLONASE) 50 MCG/ACT nasal spray Place 2 sprays into both nostrils daily. 01/24/22   Freddy Finner, NP  levocetirizine (XYZAL) 5 MG tablet Take 1 tablet (5 mg total) by mouth every evening. Patient taking differently: Take 5 mg by mouth as needed. 01/24/22   Freddy Finner, NP  montelukast  (SINGULAIR) 10 MG tablet Take 1 tablet (10 mg total) by mouth at bedtime. 01/30/22   Crain, Alphonzo Lemmings L, PA  Multiple Vitamin (MULTIVITAMIN) tablet Take 1 tablet by mouth daily.    [provider]  nitroGLYCERIN (NITROSTAT) 0.4 MG SL tablet 1 tab(s) sublingually every 5 minutes as needed for chest pain up to three doses, if no relief by third dose, call 911 and go to ED 15 days 02/28/22     VITAMIN E PO Take by mouth.    [provider]      Allergies    Phentermine, Fluvoxamine, Iodinated contrast media, Iohexol, Other, and Sertraline    Review of Systems   Review of Systems  All other systems reviewed and are negative.   Physical Exam Updated Vital Signs BP 117/86   Pulse (!) 107   Temp 98.1 F (36.7 C) (Oral)   Resp 16   Ht 5\' 9"  (1.753 m)   Wt 108.9 kg   LMP  (LMP Unknown)   SpO2 94%   BMI 35.44 kg/m  Physical Exam Vitals and nursing note reviewed.  Constitutional:      General: She is not in acute distress.    Appearance: She is well-developed. She is not diaphoretic.  HENT:     Head: Normocephalic and atraumatic.  Cardiovascular:     Rate and Rhythm: Normal rate and regular rhythm.  Heart sounds: No murmur heard.    No friction rub. No gallop.  Pulmonary:     Effort: Pulmonary effort is normal. No respiratory distress.     Breath sounds: Normal breath sounds. No wheezing.  Abdominal:     General: Bowel sounds are normal. There is no distension.     Palpations: Abdomen is soft.     Tenderness: There is no abdominal tenderness.  Musculoskeletal:        General: Normal range of motion.     Cervical back: Normal range of motion and neck supple.  Skin:    General: Skin is warm and dry.  Neurological:     General: No focal deficit present.     Mental Status: She is alert and oriented to person, place, and time.     ED Results / Procedures / Treatments   Labs (all labs ordered are listed, but only abnormal results are displayed) Labs Reviewed   BASIC METABOLIC PANEL - Abnormal; Notable for the following components:      Result Value   Glucose, Bld 106 (*)    BUN 21 (*)    Creatinine, Ser 1.01 (*)    Calcium 8.5 (*)    All other components within normal limits  CBC  PREGNANCY, URINE  TROPONIN I (HIGH SENSITIVITY)    EKG EKG Interpretation  Date/Time:  Sunday November 03 2022 23:49:20 EST Ventricular Rate:  103 PR Interval:  155 QRS Duration: 97 QT Interval:  338 QTC Calculation: 443 R Axis:   97 Text Interpretation: Sinus tachycardia Borderline right axis deviation No significant change since 10/19/2022 Confirmed by Veryl Speak [phone]) on 11/04/2022 12:29:31 AM  Radiology DG Chest 2 View  Result Date: 11/04/2022 CLINICAL DATA:  Chest pain and racing heart EXAM: CHEST - 2 VIEW COMPARISON:  10/19/2022 FINDINGS: The heart size and mediastinal contours are within normal limits. Both lungs are clear. The visualized skeletal structures are unremarkable. IMPRESSION: No active cardiopulmonary disease. Electronically Signed   By: Placido Sou M.D.   On: 11/04/2022 00:17    Procedures Procedures    Medications Ordered in ED Medications - No data to display  ED Course/ Medical Decision Making/ A&P  Patient with history of SVT presenting here with complaints of palpitations as described in the HPI.  She reports her Apple Watch telling her her heart rate was 210.  This seemed to go away with vagal maneuvers and arrives here in sinus rhythm with stable vital signs.  Workup initiated including CBC, metabolic panel, and troponin, all of which were unremarkable.  Patient's EKG upon presentation showed a sinus rhythm with no acute changes.  Patient has been observed and has had no further ectopy.  She is feeling back to normal and I believe can safely be discharged.  I will have her follow-up with her cardiologist to discuss whether or not an ablation would be appropriate.  To return as needed for any problems.  Final Clinical  Impression(s) / ED Diagnoses Final diagnoses:  None    Rx / DC Orders ED Discharge Orders     None         Veryl Speak, MD 11/04/22 0202

## 2022-11-04 NOTE — Discharge Instructions (Signed)
Follow-up with your cardiologist if symptoms persist, and return to the ER if symptoms recur and do not go away with vagal maneuvers.

## 2022-11-05 ENCOUNTER — Other Ambulatory Visit (HOSPITAL_COMMUNITY): Payer: Self-pay

## 2022-11-07 NOTE — Pre-Procedure Instructions (Signed)
Surgical Instructions    Your procedure is scheduled on Thursday, November 14, 2022 at 7:15 AM.  Report to [facility] Main Entrance "A" at 5:30 A.M., then check in with the Admitting office.  Call this number if you have problems the morning of surgery:  [phone]   If you have any questions prior to your surgery date call [phone]: Open Monday-Friday 8am-4pm  *If you experience any cold or flu symptoms such as cough, fever, chills, shortness of breath, etc. between now and your scheduled surgery, please notify us.*    Remember:  Do not eat after midnight the night before your surgery  You may drink clear liquids until 4:30 AM the morning of your surgery.   Clear liquids allowed are: Water, Non-Citrus Juices (without pulp), Carbonated Beverages, Clear Tea, Black Coffee Only (NO MILK, CREAM OR POWDERED CREAMER of any kind), and Gatorade.  Patient Instructions  The night before surgery:  No food after midnight. ONLY clear liquids after midnight  The day of surgery (if you do NOT have diabetes):  Drink ONE (1) Pre-Surgery Clear Ensure by 4:30 AM the morning of surgery. Drink in one sitting. Do not sip.  This drink was given to you during your hospital  pre-op appointment visit.  Nothing else to drink after completing the  Pre-Surgery Clear Ensure.         If you have questions, please contact your surgeon's office.     Take these medicines the morning of surgery with A SIP OF WATER:  cetirizine (ZYRTEC)  drospirenone-ethinyl estradiol (LORYNA)   IF NEEDED: diltiazem (CARDIZEM)  fluticasone (FLONASE)  nitroGLYCERIN (NITROSTAT)   As of today, STOP taking any Aspirin (unless otherwise instructed by your surgeon) Aleve, Naproxen, Ibuprofen, Motrin, Advil, Goody's, BC's, all herbal medications, fish oil, and all vitamins.                     Do NOT Smoke (Tobacco/Vaping) for 24 hours prior to your procedure.  If you use a CPAP at night, you may bring your  mask/headgear for your overnight stay.   Contacts, glasses, piercing's, hearing aid's, dentures or partials may not be worn into surgery, please bring cases for these belongings.    For patients admitted to the hospital, discharge time will be determined by your treatment team.   Patients discharged the day of surgery will not be allowed to drive home, and someone needs to stay with them for 24 hours.  SURGICAL WAITING ROOM VISITATION Patients having surgery or a procedure may have two support people in the waiting area. Visitors may stay in the waiting area during the procedure and switch out with other visitors if needed. Only 1 support person is allowed in the pre-op area with the patient AFTER the patient is prepped. This person cannot be switched out. Children under the age of 80 must have an adult accompany them who is not the patient. If the patient needs to stay at the hospital during part of their recovery, the visitor guidelines for inpatient rooms apply.  Please refer to the [facility] website for the visitor guidelines for Inpatients (after your surgery is over and you are in a regular room).    Special instructions:   [place]- Preparing For Surgery  Before surgery, you can play an important role. Because skin is not sterile, your skin needs to be as free of germs as possible. You can reduce the number of germs on your skin by washing with CHG (  chlorahexidine gluconate) Soap before surgery.  CHG is an antiseptic cleaner which kills germs and bonds with the skin to continue killing germs even after washing.    Oral Hygiene is also important to reduce your risk of infection.  Remember - BRUSH YOUR TEETH THE MORNING OF SURGERY WITH YOUR REGULAR TOOTHPASTE  Please do not use if you have an allergy to CHG or antibacterial soaps. If your skin becomes reddened/irritated stop using the CHG.  Do not shave (including legs and underarms) for at least 48 hours prior to first CHG shower.  It is OK to shave your face.  Please follow these instructions carefully.   Shower the NIGHT BEFORE SURGERY and the MORNING OF SURGERY  If you chose to wash your hair, wash your hair first as usual with your normal shampoo.  After you shampoo, rinse your hair and body thoroughly to remove the shampoo.  Use CHG Soap as you would any other liquid soap. You can apply CHG directly to the skin and wash gently with a scrungie or a clean washcloth.   Apply the CHG Soap to your body ONLY FROM THE NECK DOWN.  Do not use on open wounds or open sores. Avoid contact with your eyes, ears, mouth and genitals (private parts). Wash Face and genitals (private parts)  with your normal soap.   Wash thoroughly, paying special attention to the area where your surgery will be performed.  Thoroughly rinse your body with warm water from the neck down.  DO NOT shower/wash with your normal soap after using and rinsing off the CHG Soap.  Pat yourself dry with a CLEAN TOWEL.  Wear CLEAN PAJAMAS to bed the night before surgery  Place CLEAN SHEETS on your bed the night before your surgery  DO NOT SLEEP WITH PETS.   Day of Surgery: Take a shower with CHG soap. Do not wear jewelry or makeup Do not wear lotions, powders, perfumes/colognes, or deodorant. Do not shave 48 hours prior to surgery. Do not wear nail polish, gel polish, artificial nails, or any other type of covering on natural nails (fingers and toes) If you have artificial nails or gel coating that need to be removed by a nail salon, please have this removed prior to surgery. Artificial nails or gel coating may interfere with anesthesia's ability to adequately monitor your vital signs. Wear Clean/Comfortable clothing the morning of surgery Do not bring valuables to the hospital.  [facility] is not responsible for any belongings or valuables. Remember to brush your teeth WITH YOUR REGULAR TOOTHPASTE.   Please read over the following fact sheets  that you were given.  If you received a COVID test during your pre-op visit  it is requested that you wear a mask when out in public, stay away from anyone that may not be feeling well and notify your surgeon if you develop symptoms. If you have been in contact with anyone that has tested positive in the last 10 days please notify you surgeon.

## 2022-11-08 ENCOUNTER — Encounter (HOSPITAL_COMMUNITY): Payer: Self-pay

## 2022-11-08 ENCOUNTER — Other Ambulatory Visit: Payer: Self-pay

## 2022-11-08 ENCOUNTER — Encounter (HOSPITAL_COMMUNITY)
Admission: RE | Admit: 2022-11-08 | Discharge: 2022-11-08 | Disposition: A | Discharge status: Home / Self Care | Payer: Commercial Managed Care - PPO | Source: Ambulatory Visit | Attending: Orthopaedic Surgery

## 2022-11-08 DIAGNOSIS — S82002A Unspecified fracture of left patella, initial encounter for closed fracture: Secondary | ICD-10-CM | POA: Diagnosis not present

## 2022-11-08 DIAGNOSIS — J45909 Unspecified asthma, uncomplicated: Secondary | ICD-10-CM | POA: Diagnosis not present

## 2022-11-08 DIAGNOSIS — I471 Supraventricular tachycardia, unspecified: Secondary | ICD-10-CM | POA: Insufficient documentation

## 2022-11-08 DIAGNOSIS — Z01818 Encounter for other preprocedural examination: Secondary | ICD-10-CM | POA: Diagnosis present

## 2022-11-08 DIAGNOSIS — G809 Cerebral palsy, unspecified: Secondary | ICD-10-CM | POA: Diagnosis not present

## 2022-11-08 DIAGNOSIS — F419 Anxiety disorder, unspecified: Secondary | ICD-10-CM | POA: Insufficient documentation

## 2022-11-08 DIAGNOSIS — X58XXXA Exposure to other specified factors, initial encounter: Secondary | ICD-10-CM | POA: Diagnosis not present

## 2022-11-08 DIAGNOSIS — G43909 Migraine, unspecified, not intractable, without status migrainosus: Secondary | ICD-10-CM | POA: Diagnosis not present

## 2022-11-08 HISTORY — DX: Depression, unspecified: F32.A

## 2022-11-08 NOTE — Progress Notes (Signed)
PCP - Raelyn Number, PA Cardiologist - Dr. Vernell Leep  PPM/ICD - Denies  Chest x-ray - N/A EKG - 11/03/22 Stress Test - 04/06/22 ECHO - 03/23/22 Cardiac Cath - Denies  Sleep Study - Denies  Diabetes: Denies  Blood Thinner Instructions: N/A Aspirin Instructions: N/A  ERAS Protcol - Yes, PRE-SURGERY Ensure  COVID TEST- N/A   Anesthesia review: Yes, Cerebral Palsy, new hx of SVT; Patient will be seeing cardiologist Monday.  Patient denies shortness of breath, fever, cough and chest pain at PAT appointment   All instructions explained to the patient, with a verbal understanding of the material. Patient agrees to go over the instructions while at home for a better understanding. Patient also instructed to self quarantine after being tested for COVID-19. The opportunity to ask questions was provided.

## 2022-11-11 ENCOUNTER — Ambulatory Visit: Payer: Commercial Managed Care - PPO | Admitting: Cardiology

## 2022-11-11 ENCOUNTER — Encounter: Payer: Self-pay | Admitting: Cardiology

## 2022-11-11 VITALS — BP 131/90 | HR 87 | Resp 16 | Ht 69.0 in | Wt 248.0 lb

## 2022-11-11 DIAGNOSIS — I471 Supraventricular tachycardia, unspecified: Secondary | ICD-10-CM

## 2022-11-11 DIAGNOSIS — R030 Elevated blood-pressure reading, without diagnosis of hypertension: Secondary | ICD-10-CM

## 2022-11-11 MED ORDER — METOPROLOL SUCCINATE ER 25 MG PO TB24: 25.0000 mg | ORAL_TABLET | Freq: Every day | ORAL | 3 refills | Status: DC

## 2022-11-11 NOTE — Progress Notes (Signed)
Patient referred by Trey Sailors, PA for palpitations  Subjective:   Julia Anderson, female    DOB: 24-Oct-1986, 37 y.o.   MRN: [id number]   Chief Complaint  Patient presents with   PSVT   Follow-up    6 month   Medical Clearance    LT ankle surgery     HPI  37 y.o. Caucasian female with cerebral palsy, PSVT  Patient had two episode of palpitations with HR up tp 210 bpm (Dec 2023, Jan 2024), similar to her previous PSVT episode. She was able to abort the episode with vagal manuevers.  Patient has an upcoming surgery on her left ankle-arthrodesis.  She has been stressed due to same.   Current Outpatient Medications:    Blood Pressure Monitoring (OMRON 3 SERIES BP MONITOR) DEVI, Use as directed, Disp: 1 each, Rfl: 0   cetirizine (ZYRTEC) 10 MG tablet, Take 10 mg by mouth daily., Disp: , Rfl:    diltiazem (CARDIZEM) 30 MG tablet, Take 1 tablet (30 mg total) by mouth 3 (three) times daily as needed., Disp: 60 tablet, Rfl: 3   drospirenone-ethinyl estradiol (LORYNA) 3-0.02 MG tablet, Take 1 tablet by mouth daily., Disp: 28 tablet, Rfl: 10   fluticasone (FLONASE) 50 MCG/ACT nasal spray, Place 2 sprays into both nostrils daily. (Patient taking differently: Place 2 sprays into both nostrils daily as needed for allergies.), Disp: 16 g, Rfl: 0   ascorbic acid (VITAMIN C) 500 MG tablet, Take 500 mg by mouth daily. (Patient not taking: Reported on 11/11/2022), Disp: , Rfl:    Cholecalciferol (VITAMIN D) 50 MCG (2000 UT) tablet, Take 2,000 Units by mouth daily. (Patient not taking: Reported on 11/11/2022), Disp: , Rfl:    ibuprofen (ADVIL) 200 MG tablet, Take 600 mg by mouth every 6 (six) hours as needed for moderate pain. (Patient not taking: Reported on 11/11/2022), Disp: , Rfl:    Melatonin 10 MG CAPS, Take 10 mg by mouth at bedtime as needed (sleep). (Patient not taking: Reported on 11/11/2022), Disp: , Rfl:    Multiple Vitamin (MULTIVITAMIN) tablet, Take 1 tablet by mouth daily.  (Patient not taking: Reported on 11/11/2022), Disp: , Rfl:    nitroGLYCERIN (NITROSTAT) 0.4 MG SL tablet, 1 tab(s) sublingually every 5 minutes as needed for chest pain up to three doses, if no relief by third dose, call 911 and go to ED 15 days (Patient not taking: Reported on 11/11/2022), Disp: 45 tablet, Rfl: 0   vitamin E 180 MG (400 UNITS) capsule, Take 400 Units by mouth every other day. (Patient not taking: Reported on 11/11/2022), Disp: , Rfl:    Cardiovascular and other pertinent studies:  EKG 11/11/2022: Sinus rhythm 73 bpm RSR(V1) -nondiagnostic  Exercise treadmill stress test 04/05/2022: Exercise treadmill stress test performed using Bruce protocol.  Patient reached 6.3 METS, and 91% of age predicted maximum heart rate.  Exercise capacity was low.  No chest pain reported.  Normal heart rate and hemodynamic response. Stress EKG revealed no ischemic changes. Low risk study.  Echocardiogram 03/22/2022:  Left ventricle cavity is normal in size and wall thickness. Normal global  wall motion. Normal LV systolic function with EF 55%. Normal diastolic  filling pattern.  No significant valvular abnormality.  No evidence of pulmonary hypertension.    Reviewed external labs and tests, independently interpreted  EKG 11/11/2022: Sinus rhythm 73 bpm RSR(V1) -nondiagnostic  EKG 03/07/2022 11:18 Supraventricular tachycardia Possible Right ventricular hypertrophy ST & T wave abnormality, consider inferior ischemia ST &  T wave abnormality,   Recent labs: 11/03/2022: Glucose 106, BUN/Cr 21/1.01. EGFR >60. Na/K 137/3.8.  H/H 15/44. MCV 93. Platelets 222 Trop HS <2   03/07/2022: Glucose 93, BUN/Cr 11/0.92. EGFR >60. Na/K 139/4.0. Rest of the CMP normal H/H 12/37. MCV 94. Platelets 203 HbA1C NA Lipid panel NA TSH 1.8 normal   Latest Reference Range & Units 03/07/22 11:12 03/07/22 14:18  Troponin I (High Sensitivity) <18 ng/L 337 (HH) 251 (HH)  (HH): Data is critically high    Review  of Systems  Cardiovascular:  Positive for palpitations. Negative for chest pain, dyspnea on exertion, leg swelling and syncope.  Neurological:  Positive for light-headedness.         Vitals:   11/11/22 1145  BP: (!) 138/97  Pulse: 88  Resp: 16  SpO2: 98%     Body mass index is 36.62 kg/m. Filed Weights   11/11/22 1145  Weight: 248 lb (112.5 kg)     Objective:   Physical Exam Vitals and nursing note reviewed.  Constitutional:      General: She is not in acute distress. Neck:     Vascular: No JVD.  Cardiovascular:     Rate and Rhythm: Normal rate and regular rhythm.     Heart sounds: Normal heart sounds. No murmur heard. Pulmonary:     Effort: Pulmonary effort is normal.     Breath sounds: Normal breath sounds. No wheezing or rales.  Musculoskeletal:     Right lower leg: No edema.     Left lower leg: No edema.         Visit diagnoses: No diagnosis found.     Assessment & Recommendations:    37 y.o. Caucasian female with cerebral palsy, PSVT, elevated blood pressure without diagnosis of hypertension  PSVT: Episodes in May 2023, December 2020, January 2024. 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  However, she has an upcoming left ankle surgery later this week, which will require prolonged recovery.  Including immobilization. I started on metoprolol succinate 25 mg daily.  I will see her back in 3 months.  If she continues to have recurrent episodes, will then refer her to EP for possible ablation.  Preop evaluation: Overall, low cardiac risk.  That said, it is possible that she could have one of her PSVT episodes during the surgery.  Episodes should be easily been able to IV adenosine.  Other than ablation, unfortunately there is no definitive short-term cure for this.  I do not think her ankle surgery should be delayed, but heart rhythm should be monitored intraoperatively by anesthesiology.  I will be available for  input.  Troponin elevation: Occurred in 02/2022, in the setting of PSVT episode at 209 bpm.  No ischemia on exercise treadmill EKG.  CT cardiac scoring pending, but can hold for now.  Elevated blood pressure without diagnosis of hypertension: Discussed low-salt diet. Also discussed intermittent fasting for weight loss. Metoprolol should help.  F/u in 3 months    Nigel Mormon, MD Pager: [phone] Office: [phone]

## 2022-11-11 NOTE — Progress Notes (Signed)
Anesthesia Chart Review:  Case: [id number] Date/Time: 11/14/22 0700   Procedure: LEFT SUBTALAR JOINT ARTHRODESIS (Left: Foot) - LENGTH OF SURGERY: 120 MINUTES   Anesthesia type: General   Pre-op diagnosis: LEFT SUBTALAR JOINT DEGENERATIVE CHANGE   Location: [place] OR ROOM 06 / [place]   Surgeons: Erle Crocker, MD       DISCUSSION: Patient is a 37 year old female scheduled for the above procedure.  History includes never smoker, asthma, migraines, anxiety, cerebral palsy, SVT (03/07/22 @ 210, peak HS troponin 337, converted after Valsalva maneuver; probable recurrence at home 10/18/22 & 11/03/22 per Apple Watch, treated with Valsalva maneuver),  left patellar fracture (ORIF 12/24/17, removal of hardware 04/03/18).  Last cardiology evaluation by Dr. Virgina Jock was on 11/11/22. He noted that she required Valsalva maneuvers x2 since December 2023. Office EKG showed SR. 03/22/22 echo showed LVEF 55%, normal wall motion, no significant valvular abnormality, no evidence of pulmonary hypertension. Since she had elevated troponins in May 2023 in setting of PSVT, an ETT was done on 04/05/22 which was non-ischemic. He may consider future CT cardiac scoring, but felt okay to hold off for now.  Due to recurrent PSVT, he discussed consideration of ablation therapy; however due to upcoming left ankle surgery that may require prolonged recovery, they opted for her to start metoprolol succinate 25 mg daily with 3 month follow-up. If recurrent episodes of SVT then will plan EP referral for consideration of ablation. In regards to surgery, he wrote: "Overall, low cardiac risk.  That said, it is possible that she could have one of her PSVT episodes during the surgery.  Episodes should be easily been able to IV adenosine.  Other than ablation, unfortunately there is no definitive short-term cure for this.  I do not think her ankle surgery should be delayed, but heart rhythm should be monitored intraoperatively by anesthesiology.  I  will be available for input."   Anesthesia team to evaluate on the day of surgery. She had labs on 11/03/22 during her ED visit. For urine pregnancy test on arrival.     VS: BP (!) 137/97   Pulse 76   Temp 36.7 C   Resp 16   Ht 5\' 9"  (1.753 m)   Wt 111.9 kg   LMP  (LMP Unknown) Comment: Hasnt had a period in almost a year  SpO2 97%   BMI 36.45 kg/m   PROVIDERS: Trey Sailors, PA is PCP  Vernell Leep, MD is cardiologist   LABS: Most recent labs in [facility] include: Lab Results  Component Value Date   WBC 5.3 11/03/2022   HGB 15.0 11/03/2022   HCT 44.8 11/03/2022   PLT 222 11/03/2022   GLUCOSE 106 (H) 11/03/2022   AST 20 03/07/2022   NA 137 11/03/2022   K 3.8 11/03/2022   CL 103 11/03/2022   CREATININE 1.01 (H) 11/03/2022   BUN 21 (H) 11/03/2022   CO2 23 11/03/2022   TSH 1.853 03/07/2022     IMAGES: CXR 11/04/22: FINDINGS: The heart size and mediastinal contours are within normal limits. Both lungs are clear. The visualized skeletal structures are unremarkable. IMPRESSION: No active cardiopulmonary disease.   EKG: EKG 11/11/2022: Sinus rhythm 73 bpm RSR(V1) -nondiagnostic   CV: Exercise treadmill stress test 04/05/2022: Exercise treadmill stress test performed using Bruce protocol.  Patient reached 6.3 METS, and 91% of age predicted maximum heart rate.  Exercise capacity was low.  No chest pain reported.  Normal heart rate and hemodynamic response. Stress  EKG revealed no ischemic changes. Low risk study.    Echocardiogram 03/22/2022:  Left ventricle cavity is normal in size and wall thickness. Normal global  wall motion. Normal LV systolic function with EF 55%. Normal diastolic  filling pattern.  No significant valvular abnormality.  No evidence of pulmonary hypertension.    Past Medical History:  Diagnosis Date   Allergy    Anxiety    Asthma    Cerebral palsy (HCC)    Depression    Migraine    SVT (supraventricular tachycardia) 2024     Past Surgical History:  Procedure Laterality Date   FOOT CAPSULE RELEASE W/ PERCUTANEOUS HEEL CORD LENGTHENING, TIBIAL TENDON TRANSFER     lengthen a tight heel cord left leg    MEDICATIONS:  Blood Pressure Monitoring (OMRON 3 SERIES BP MONITOR) DEVI   cetirizine (ZYRTEC) 10 MG tablet   diltiazem (CARDIZEM) 30 MG tablet   drospirenone-ethinyl estradiol (LORYNA) 3-0.02 MG tablet   metoprolol succinate (TOPROL-XL) 25 MG 24 hr tablet   No current facility-administered medications for this encounter.    Myra Gianotti, PA-C Surgical Short Stay/Anesthesiology [facility] Phone [phone] [facility] Phone [phone] 11/12/2022 10:35 AM

## 2022-11-12 NOTE — Anesthesia Preprocedure Evaluation (Addendum)
Anesthesia Evaluation  Patient identified by MRN, date of birth, ID band Patient awake    Reviewed: Allergy & Precautions, NPO status , Patient's Chart, lab work & pertinent test results, reviewed documented beta blocker date and time   Airway Mallampati: II  TM Distance: >3 FB Neck ROM: Full    Dental no notable dental hx. (+) Teeth Intact   Pulmonary asthma    Pulmonary exam normal breath sounds clear to auscultation       Cardiovascular Normal cardiovascular exam+ dysrhythmias Supra Ventricular Tachycardia  Rhythm:Regular Rate:Normal  On metoprolol Most likely reentrant tachycardia   Neuro/Psych  Headaches PSYCHIATRIC DISORDERS Anxiety Depression    CP    GI/Hepatic negative GI ROS, Neg liver ROS,,,  Endo/Other  negative endocrine ROS    Renal/GU negative Renal ROS  negative genitourinary   Musculoskeletal negative musculoskeletal ROS (+)  Subtalar degenerative join changes left ankle   Abdominal   Peds  Hematology   Anesthesia Other Findings   Reproductive/Obstetrics negative OB ROS                              Anesthesia Physical Anesthesia Plan  ASA: 2  Anesthesia Plan: General   Post-op Pain Management: Regional block* and Minimal or no pain anticipated   Induction: Intravenous  PONV Risk Score and Plan: 4 or greater and Treatment may vary due to age or medical condition, Dexamethasone and Ondansetron  Airway Management Planned: LMA  Additional Equipment: None  Intra-op Plan:   Post-operative Plan: Extubation in OR  Informed Consent: I have reviewed the patients History and Physical, chart, labs and discussed the procedure including the risks, benefits and alternatives for the proposed anesthesia with the patient or authorized representative who has indicated his/her understanding and acceptance.     Dental advisory given  Plan Discussed with: Anesthesiologist and  CRNA  Anesthesia Plan Comments: (PAT note written 11/12/2022 by Myra Gianotti, PA-C.  Cardiologist is Dr. Virgina Jock. Per 11/11/22 evaluation, "Overall, low cardiac risk.  That said, it is possible that she could have one of her PSVT episodes during the surgery.  Episodes should be easily been able to IV adenosine.  Other than ablation, unfortunately there is no definitive short-term cure for this.  I do not think her ankle surgery should be delayed, but heart rhythm should be monitored intraoperatively by anesthesiology.  I will be available for input." )        Anesthesia Quick Evaluation

## 2022-11-13 ENCOUNTER — Encounter (HOSPITAL_COMMUNITY): Payer: Self-pay | Admitting: Orthopaedic Surgery

## 2022-11-14 ENCOUNTER — Ambulatory Visit (HOSPITAL_COMMUNITY): Payer: Commercial Managed Care - PPO

## 2022-11-14 ENCOUNTER — Encounter (HOSPITAL_COMMUNITY)
Admission: RE | Disposition: A | Discharge status: Home / Self Care | Payer: Self-pay | Source: Home / Self Care | Attending: Orthopaedic Surgery

## 2022-11-14 ENCOUNTER — Ambulatory Visit (HOSPITAL_COMMUNITY)
Admission: RE | Admit: 2022-11-14 | Discharge: 2022-11-14 | Disposition: A | Discharge status: Home / Self Care | Payer: Commercial Managed Care - PPO | Attending: Orthopaedic Surgery | Admitting: Orthopaedic Surgery

## 2022-11-14 ENCOUNTER — Encounter (HOSPITAL_COMMUNITY): Payer: Self-pay | Admitting: Orthopaedic Surgery

## 2022-11-14 ENCOUNTER — Other Ambulatory Visit (HOSPITAL_COMMUNITY): Payer: Self-pay

## 2022-11-14 ENCOUNTER — Ambulatory Visit (HOSPITAL_COMMUNITY): Payer: Commercial Managed Care - PPO | Admitting: Vascular Surgery

## 2022-11-14 ENCOUNTER — Ambulatory Visit (HOSPITAL_BASED_OUTPATIENT_CLINIC_OR_DEPARTMENT_OTHER): Payer: Commercial Managed Care - PPO | Admitting: Vascular Surgery

## 2022-11-14 ENCOUNTER — Other Ambulatory Visit: Payer: Self-pay

## 2022-11-14 DIAGNOSIS — F32A Depression, unspecified: Secondary | ICD-10-CM | POA: Insufficient documentation

## 2022-11-14 DIAGNOSIS — G809 Cerebral palsy, unspecified: Secondary | ICD-10-CM | POA: Diagnosis not present

## 2022-11-14 DIAGNOSIS — Z79899 Other long term (current) drug therapy: Secondary | ICD-10-CM | POA: Diagnosis not present

## 2022-11-14 DIAGNOSIS — M19072 Primary osteoarthritis, left ankle and foot: Secondary | ICD-10-CM | POA: Diagnosis present

## 2022-11-14 DIAGNOSIS — J45909 Unspecified asthma, uncomplicated: Secondary | ICD-10-CM | POA: Insufficient documentation

## 2022-11-14 DIAGNOSIS — R531 Weakness: Secondary | ICD-10-CM | POA: Diagnosis not present

## 2022-11-14 DIAGNOSIS — F419 Anxiety disorder, unspecified: Secondary | ICD-10-CM | POA: Insufficient documentation

## 2022-11-14 HISTORY — PX: FOOT ARTHRODESIS: SHX1655

## 2022-11-14 LAB — POCT PREGNANCY, URINE: Preg Test, Ur: NEGATIVE

## 2022-11-14 SURGERY — FUSION, JOINT, FOOT
Anesthesia: General | Site: Foot | Laterality: Left

## 2022-11-14 MED ORDER — LIDOCAINE 2% (20 MG/ML) 5 ML SYRINGE
INTRAMUSCULAR | Status: DC | PRN
  Administered 2022-11-14: 80 mg via INTRAVENOUS

## 2022-11-14 MED ORDER — CEFAZOLIN IN SODIUM CHLORIDE 3-0.9 GM/100ML-% IV SOLN: 3.0000 g | INTRAVENOUS | Status: DC

## 2022-11-14 MED ORDER — BUPIVACAINE HCL (PF) 0.5 % IJ SOLN
INTRAMUSCULAR | Status: DC | PRN
  Administered 2022-11-14: 10 mL via PERINEURAL
  Administered 2022-11-14: 20 mL via PERINEURAL

## 2022-11-14 MED ORDER — MIDAZOLAM HCL 2 MG/2ML IJ SOLN
INTRAMUSCULAR | Status: AC
  Filled 2022-11-14: qty 2

## 2022-11-14 MED ORDER — PHENYLEPHRINE HCL-NACL 20-0.9 MG/250ML-% IV SOLN
INTRAVENOUS | Status: DC | PRN
  Administered 2022-11-14: 30 ug/min via INTRAVENOUS

## 2022-11-14 MED ORDER — CEFAZOLIN SODIUM-DEXTROSE 2-4 GM/100ML-% IV SOLN
INTRAVENOUS | Status: AC
  Filled 2022-11-14: qty 100

## 2022-11-14 MED ORDER — LACTATED RINGERS IV SOLN: INTRAVENOUS | Status: DC

## 2022-11-14 MED ORDER — ONDANSETRON HCL 4 MG/2ML IJ SOLN: 4.0000 mg | Freq: Once | INTRAMUSCULAR | Status: DC | PRN

## 2022-11-14 MED ORDER — OXYCODONE HCL 5 MG/5ML PO SOLN: 5.0000 mg | Freq: Once | ORAL | Status: DC | PRN

## 2022-11-14 MED ORDER — ASPIRIN 325 MG PO TABS
325.0000 mg | ORAL_TABLET | Freq: Every day | ORAL | 0 refills | Status: DC
  Filled 2022-11-14: qty 30, 30d supply, fill #0

## 2022-11-14 MED ORDER — OXYCODONE HCL 5 MG PO TABS: 5.0000 mg | ORAL_TABLET | Freq: Once | ORAL | Status: DC | PRN

## 2022-11-14 MED ORDER — MIDAZOLAM HCL 2 MG/2ML IJ SOLN
INTRAMUSCULAR | Status: DC | PRN
  Administered 2022-11-14: 2 mg via INTRAVENOUS

## 2022-11-14 MED ORDER — 0.9 % SODIUM CHLORIDE (POUR BTL) OPTIME
TOPICAL | Status: DC | PRN
  Administered 2022-11-14: 1000 mL

## 2022-11-14 MED ORDER — FENTANYL CITRATE (PF) 100 MCG/2ML IJ SOLN
INTRAMUSCULAR | Status: DC | PRN
  Administered 2022-11-14: 100 ug via INTRAVENOUS

## 2022-11-14 MED ORDER — PROPOFOL 10 MG/ML IV BOLUS
INTRAVENOUS | Status: AC
  Filled 2022-11-14: qty 20

## 2022-11-14 MED ORDER — BUPIVACAINE HCL (PF) 0.5 % IJ SOLN: INTRAMUSCULAR | Status: DC | PRN

## 2022-11-14 MED ORDER — LIDOCAINE 2% (20 MG/ML) 5 ML SYRINGE
INTRAMUSCULAR | Status: AC
  Filled 2022-11-14: qty 5

## 2022-11-14 MED ORDER — ONDANSETRON HCL 4 MG/2ML IJ SOLN
INTRAMUSCULAR | Status: AC
  Filled 2022-11-14: qty 2

## 2022-11-14 MED ORDER — ORAL CARE MOUTH RINSE: 15.0000 mL | Freq: Once | OROMUCOSAL | Status: DC

## 2022-11-14 MED ORDER — ONDANSETRON HCL 4 MG/2ML IJ SOLN
INTRAMUSCULAR | Status: DC | PRN
  Administered 2022-11-14: 4 mg via INTRAVENOUS

## 2022-11-14 MED ORDER — CHLORHEXIDINE GLUCONATE 0.12 % MT SOLN: 15.0000 mL | Freq: Once | OROMUCOSAL | Status: DC

## 2022-11-14 MED ORDER — EPHEDRINE 5 MG/ML INJ
INTRAVENOUS | Status: AC
  Filled 2022-11-14: qty 5

## 2022-11-14 MED ORDER — HYDROMORPHONE HCL 1 MG/ML IJ SOLN
INTRAMUSCULAR | Status: AC
  Filled 2022-11-14: qty 0.5

## 2022-11-14 MED ORDER — PHENYLEPHRINE 80 MCG/ML (10ML) SYRINGE FOR IV PUSH (FOR BLOOD PRESSURE SUPPORT)
PREFILLED_SYRINGE | INTRAVENOUS | Status: AC
  Filled 2022-11-14: qty 10

## 2022-11-14 MED ORDER — HYDROMORPHONE HCL 1 MG/ML IJ SOLN
INTRAMUSCULAR | Status: AC
  Filled 2022-11-14: qty 1

## 2022-11-14 MED ORDER — AMISULPRIDE (ANTIEMETIC) 5 MG/2ML IV SOLN: 10.0000 mg | Freq: Once | INTRAVENOUS | Status: DC | PRN

## 2022-11-14 MED ORDER — HYDROMORPHONE HCL 1 MG/ML IJ SOLN
INTRAMUSCULAR | Status: DC | PRN
  Administered 2022-11-14 (×2): .25 mg via INTRAVENOUS

## 2022-11-14 MED ORDER — DEXAMETHASONE SODIUM PHOSPHATE 10 MG/ML IJ SOLN
INTRAMUSCULAR | Status: DC | PRN
  Administered 2022-11-14: 10 mg via INTRAVENOUS

## 2022-11-14 MED ORDER — EPHEDRINE SULFATE-NACL 50-0.9 MG/10ML-% IV SOSY
PREFILLED_SYRINGE | INTRAVENOUS | Status: DC | PRN
  Administered 2022-11-14: 10 mg via INTRAVENOUS

## 2022-11-14 MED ORDER — DEXAMETHASONE SODIUM PHOSPHATE 10 MG/ML IJ SOLN
INTRAMUSCULAR | Status: AC
  Filled 2022-11-14: qty 1

## 2022-11-14 MED ORDER — CEFAZOLIN SODIUM-DEXTROSE 2-3 GM-%(50ML) IV SOLR
INTRAVENOUS | Status: DC | PRN
  Administered 2022-11-14: 2 g via INTRAVENOUS

## 2022-11-14 MED ORDER — BUPIVACAINE LIPOSOME 1.3 % IJ SUSP
INTRAMUSCULAR | Status: DC | PRN
  Administered 2022-11-14: 10 mL via PERINEURAL

## 2022-11-14 MED ORDER — CHLORHEXIDINE GLUCONATE 0.12 % MT SOLN
OROMUCOSAL | Status: AC
  Administered 2022-11-14: 15 mL
  Filled 2022-11-14: qty 15

## 2022-11-14 MED ORDER — PHENYLEPHRINE 80 MCG/ML (10ML) SYRINGE FOR IV PUSH (FOR BLOOD PRESSURE SUPPORT)
PREFILLED_SYRINGE | INTRAVENOUS | Status: DC | PRN
  Administered 2022-11-14 (×2): 80 ug via INTRAVENOUS

## 2022-11-14 MED ORDER — FENTANYL CITRATE (PF) 100 MCG/2ML IJ SOLN
INTRAMUSCULAR | Status: AC
  Filled 2022-11-14: qty 2

## 2022-11-14 MED ORDER — PROPOFOL 10 MG/ML IV BOLUS
INTRAVENOUS | Status: DC | PRN
  Administered 2022-11-14: 50 mg via INTRAVENOUS
  Administered 2022-11-14: 200 mg via INTRAVENOUS

## 2022-11-14 MED ORDER — HYDROMORPHONE HCL 1 MG/ML IJ SOLN
0.2500 mg | INTRAMUSCULAR | Status: DC | PRN
  Administered 2022-11-14: 0.5 mg via INTRAVENOUS

## 2022-11-14 MED ORDER — OXYCODONE HCL 5 MG PO TABS
5.0000 mg | ORAL_TABLET | ORAL | 0 refills | Status: DC | PRN
  Filled 2022-11-14: qty 30, 5d supply, fill #0

## 2022-11-14 MED ORDER — BUPIVACAINE HCL (PF) 0.5 % IJ SOLN
INTRAMUSCULAR | Status: AC
  Filled 2022-11-14: qty 30

## 2022-11-14 SURGICAL SUPPLY — 59 items
APL PRP STRL LF DISP 70% ISPRP (MISCELLANEOUS) ×1
APL SKNCLS STERI-STRIP NONHPOA (GAUZE/BANDAGES/DRESSINGS)
BANDAGE ESMARK 6X9 LF (GAUZE/BANDAGES/DRESSINGS) IMPLANT
BENZOIN TINCTURE PRP APPL 2/3 (GAUZE/BANDAGES/DRESSINGS) IMPLANT
BIT DRILL 4 MINI HUDSON CANN (BIT) IMPLANT
BLADE SURG 15 STRL LF DISP TIS (BLADE) ×3 IMPLANT
BLADE SURG 15 STRL SS (BLADE) ×3
BNDG CMPR 9X4 STRL LF SNTH (GAUZE/BANDAGES/DRESSINGS)
BNDG CMPR 9X6 STRL LF SNTH (GAUZE/BANDAGES/DRESSINGS)
BNDG ELASTIC 6X5.8 VLCR STR LF (GAUZE/BANDAGES/DRESSINGS) ×2 IMPLANT
BNDG ESMARK 4X9 LF (GAUZE/BANDAGES/DRESSINGS) IMPLANT
BNDG ESMARK 6X9 LF (GAUZE/BANDAGES/DRESSINGS)
BONE CANC CHIPS 20CC PCAN1/4 (Bone Implant) ×1 IMPLANT
CHIPS CANC BONE 20CC PCAN1/4 (Bone Implant) ×1 IMPLANT
CHLORAPREP W/TINT 26 (MISCELLANEOUS) ×1 IMPLANT
COVER BACK TABLE 60X90IN (DRAPES) ×1 IMPLANT
CUFF TOURN SGL QUICK 34 (TOURNIQUET CUFF) ×1
CUFF TRNQT CYL 34X4.125X (TOURNIQUET CUFF) ×1 IMPLANT
DRAPE C-ARM 42X72 X-RAY (DRAPES) ×1 IMPLANT
DRAPE U-SHAPE 47X51 STRL (DRAPES) ×1 IMPLANT
DRSG XEROFORM 1X8 (GAUZE/BANDAGES/DRESSINGS) IMPLANT
ELECT REM PT RETURN 9FT ADLT (ELECTROSURGICAL) ×1
ELECTRODE REM PT RTRN 9FT ADLT (ELECTROSURGICAL) ×1 IMPLANT
GAUZE SPONGE 4X4 12PLY STRL (GAUZE/BANDAGES/DRESSINGS) ×1 IMPLANT
GAUZE SPONGE 4X4 12PLY STRL LF (GAUZE/BANDAGES/DRESSINGS) IMPLANT
GAUZE XEROFORM 1X8 LF (GAUZE/BANDAGES/DRESSINGS) ×1 IMPLANT
GLOVE BIOGEL M STRL SZ7.5 (GLOVE) ×4 IMPLANT
GLOVE BIOGEL PI IND STRL 8 (GLOVE) ×2 IMPLANT
GOWN STRL REUS W/ TWL XL LVL3 (GOWN DISPOSABLE) ×2 IMPLANT
GOWN STRL REUS W/TWL XL LVL3 (GOWN DISPOSABLE) ×2
GRAFT BNE CANC CHIPS 1-8 20CC (Bone Implant) IMPLANT
GUIDEWIRE 2.4 HINDFOOT (WIRE) ×2
GUIDEWIRE W/TROCAR TIP .094X8 (WIRE) IMPLANT
KIT BIOCARTILAGE LG JOINT MIX (KITS) IMPLANT
NS IRRIG 1000ML POUR BTL (IV SOLUTION) ×1 IMPLANT
PACK ORTHO EXTREMITY (CUSTOM PROCEDURE TRAY) ×1 IMPLANT
PAD CAST 4YDX4 CTTN HI CHSV (CAST SUPPLIES) ×1 IMPLANT
PADDING CAST COTTON 4X4 STRL (CAST SUPPLIES) ×1
PADDING CAST SYNTHETIC 4X4 STR (CAST SUPPLIES) ×1 IMPLANT
SCREW LOW PRO CANN 6.7X40 (Screw) IMPLANT
SCREW LP 6.7X80 (Screw) IMPLANT
SLEEVE SCD COMPRESS KNEE MED (STOCKING) ×1 IMPLANT
SPIKE FLUID TRANSFER (MISCELLANEOUS) IMPLANT
SPLINT FIBERGLASS 4X30 (CAST SUPPLIES) IMPLANT
SPLINT PLASTER CAST FAST 5X30 (CAST SUPPLIES) ×20 IMPLANT
STRIP CLOSURE SKIN 1/2X4 (GAUZE/BANDAGES/DRESSINGS) IMPLANT
SUCTION FRAZIER HANDLE 10FR (MISCELLANEOUS) ×1
SUCTION TUBE FRAZIER 10FR DISP (MISCELLANEOUS) ×1 IMPLANT
SUT ETHILON 3 0 PS 1 (SUTURE) ×1 IMPLANT
SUT MNCRL AB 3-0 PS2 18 (SUTURE) ×1 IMPLANT
SUT PDS AB 2-0 CT2 27 (SUTURE) ×1 IMPLANT
SUT VIC AB 2-0 CT1 27 (SUTURE) ×1
SUT VIC AB 2-0 CT1 TAPERPNT 27 (SUTURE) IMPLANT
SUT VIC AB 3-0 FS2 27 (SUTURE) IMPLANT
SYR 5ML LL (SYRINGE) ×1 IMPLANT
SYR BULB EAR ULCER 3OZ GRN STR (SYRINGE) ×1 IMPLANT
TOWEL GREEN STERILE FF (TOWEL DISPOSABLE) ×2 IMPLANT
TUBE CONNECTING 20X1/4 (TUBING) ×1 IMPLANT
UNDERPAD 30X36 HEAVY ABSORB (UNDERPADS AND DIAPERS) ×1 IMPLANT

## 2022-11-14 NOTE — Op Note (Signed)
Julia Anderson female 37 y.o. 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 female had significant pain in her subtalar joint and weakness in her peroneal musculature due to underlying cerebral palsy.  She had gait abnormality and due to instability and weakness was indicated for subtalar fusion.   Patient understood the risks, benefits and alternatives to surgery which include but are not limited to wound healing complications, infection, nonunion, malunion, need for further surgery as well as damage to surrounding structures. They also understood the potential for continued pain in that there were no guarantees of acceptable outcome After weighing these risks the patient opted to proceed with surgery.  PROCEDURE: Patient was identified in the preoperative holding area.  The left foot was marked by myself.  Consent was signed by myself and the patient.  Block was performed by anesthesia in the preoperative holding area.  Patient was taken to the operative suite and placed supine on the operative table.  General LMA anesthesia was induced without difficulty. Bump was placed under the operative hip and bone foam was used.  All bony prominences were well padded.  Tourniquet was placed on the operative thigh.  Preoperative antibiotics were given. The extremity was prepped and draped in the usual sterile fashion and surgical timeout was performed.  The limb was elevated  and the tourniquet was inflated to 250 mmHg.  We began by marking out the sinus Tarsi area.  An incision was made along the sinus Tarsi approach.  This was carried down through skin and subcutaneous tissue.  Blunt dissection was used to mobilize skin flaps and the incision was carried deep to the subtalar joint.  The sinus Tarsi was identified and the subtalar joint was opened.  Then the sheath along the peroneal tendons was opened for retraction.  Calcaneofibular ligament was transected as was the interosseous ligaments within the subtalar joint.  Then a lamina spreader was placed within the subtalar joint to distract the joint for joint prep.  There was significant arthrosis along the posterior medial aspect of the subtalar joint.  Using a curette and rondure the remaining cartilage surfaces were cleared of remaining cartilage.  This was done for the posterior, middle and anterior facet of the subtalar joint.  Care was taken to remove all cartilage.  Then fibrous tissue was removed from the joint as well using a rongeured.  The joints were copiously irrigated.    We then proceeded to trephinate the bony surfaces with a drill.  This was done again for the posterior, middle and anterior facet of the joint.  The joint was then reduced and pinned from dorsal talar neck across the posterior facet of the subtalar joint.  This was confirmed to be adequately reduced on fluoroscopy.  Once acceptable position of the K wire was confirmed a second K wire was placed from the plantar lateral aspect of the calcaneus across the posterior facet of the subtalar joint into the lateral aspect of the talar body.  This was again confirmed on fluoroscopy.  Then the appropriate screw length were obtained using  a measuring device and screws were placed starting with the dorsal screw followed but the plantar lateral screw.  There was good fixation and compression of the joint after screw placement.  Then cancellous bone chips was  placed within the sinus Tarsi and within the open gapping within the subtalar joint.  This was packed well.  Was then irrigated with normal saline.  Final fluoroscopic images were obtained.  The wounds were then closed in a layered fashion using 2-0 Vicryl, 3-0 Monocryl and 3-0 nylon suture.  Tourniquet was then released.  Soft dressing and short leg splint was placed.  She was awakened from anesthesia and taken recovery in stable condition.  No complications.    POST OPERATIVE INSTRUCTIONS: Nonweightbearing to operative extremity Keep splint dry and intact Follow-up in 2 weeks for splint removal, nonweightbearing x-rays of the foot and ankle and placement of a short leg cast.  TOURNIQUET TIME: Less than 2 hours  BLOOD LOSS:  Minimal         DRAINS: none         SPECIMEN: none       COMPLICATIONS:  * No complications entered in OR log *         Disposition: PACU - hemodynamically stable.         Condition: stable

## 2022-11-14 NOTE — Anesthesia Procedure Notes (Signed)
Anesthesia Regional Block: Adductor canal block   Pre-Anesthetic Checklist: , timeout performed,  Correct Patient, Correct Site, Correct Laterality,  Correct Procedure, Correct Position, site marked,  Risks and benefits discussed,  Surgical consent,  Pre-op evaluation,  At surgeon's request and post-op pain management  Laterality: Left  Prep: chloraprep       Needles:  Injection technique: Single-shot  Needle Type: Echogenic Stimulator Needle     Needle Length: 10cm  Needle Gauge: 21   Needle insertion depth: 8 cm   Additional Needles:   Procedures:,,,, ultrasound used (permanent image in chart),,    Narrative:  Start time: 11/14/2022 7:15 AM End time: 11/14/2022 7:20 AM Injection made incrementally with aspirations every 5 mL.  Performed by: Personally  Anesthesiologist: Josephine Igo, MD  Additional Notes: Timeout performed. Patient sedated. Relevant anatomy ID'd using [place]. Incremental 2-49ml injection of LA with frequent aspiration. Patient tolerated procedure well.     Left Adductor Canal Block

## 2022-11-14 NOTE — Anesthesia Postprocedure Evaluation (Signed)
Anesthesia Post Note  Patient: SAWSAN RIGGIO  Procedure(s) Performed: LEFT SUBTALAR JOINT ARTHRODESIS (Left: Foot)     Patient location during evaluation: PACU Anesthesia Type: General Level of consciousness: awake and alert and oriented Pain management: pain level controlled Vital Signs Assessment: post-procedure vital signs reviewed and stable Respiratory status: spontaneous breathing, nonlabored ventilation and respiratory function stable Cardiovascular status: blood pressure returned to baseline and stable Postop Assessment: no apparent nausea or vomiting Anesthetic complications: no   No notable events documented.  Last Vitals:  Vitals:   11/14/22 0930 11/14/22 0945  BP: (!) 149/89 137/84  Pulse: 83 78  Resp: 18 15  Temp:  36.4 C  SpO2: 100% 96%    Last Pain:  Vitals:   11/14/22 0945  TempSrc:   PainSc: 4                  Kmarion Rawl A.

## 2022-11-14 NOTE — Anesthesia Procedure Notes (Signed)
Procedure Name: LMA Insertion Date/Time: 11/14/2022 7:36 AM  Performed by: Lind Guest, CRNAPre-anesthesia Checklist: Patient identified, Emergency Drugs available, Suction available, Timeout performed and Patient being monitored Patient Re-evaluated:Patient Re-evaluated prior to induction Oxygen Delivery Method: Circle system utilized Preoxygenation: Pre-oxygenation with 100% oxygen Induction Type: IV induction LMA: LMA inserted LMA Size: 4.0 Number of attempts: 1 Placement Confirmation: positive ETCO2 and breath sounds checked- equal and bilateral Tube secured with: Tape

## 2022-11-14 NOTE — Discharge Instructions (Signed)

## 2022-11-14 NOTE — Anesthesia Procedure Notes (Addendum)
Anesthesia Regional Block: Popliteal block   Pre-Anesthetic Checklist: , timeout performed,  Correct Patient, Correct Site, Correct Laterality,  Correct Procedure, Correct Position, site marked,  Risks and benefits discussed,  Surgical consent,  Pre-op evaluation,  At surgeon's request and post-op pain management  Laterality: Left  Prep: chloraprep       Needles:  Injection technique: Single-shot  Needle Type: Echogenic Stimulator Needle     Needle Length: 10cm  Needle Gauge: 21   Needle insertion depth: 7 cm   Additional Needles:   Procedures:,,,, ultrasound used (permanent image in chart),,    Narrative:  Start time: 11/14/2022 7:10 AM End time: 11/14/2022 7:15 AM Injection made incrementally with aspirations every 5 mL.  Performed by: Personally  Anesthesiologist: Josephine Igo, MD  Additional Notes: Timeout performed. Patient sedated. Relevant anatomy ID'd using [place]. Incremental 2-54ml injection of LA with frequent aspiration. Patient tolerated procedure well.     Left Popliteal Block

## 2022-11-14 NOTE — Transfer of Care (Signed)
Immediate Anesthesia Transfer of Care Note  Patient: Julia Anderson  Procedure(s) Performed: LEFT SUBTALAR JOINT ARTHRODESIS (Left: Foot)  Patient Location: PACU  Anesthesia Type:GA combined with regional for post-op pain  Level of Consciousness: drowsy and patient cooperative  Airway & Oxygen Therapy: Patient Spontanous Breathing  Post-op Assessment: Report given to RN and Post -op Vital signs reviewed and stable  Post vital signs: Reviewed and stable  Last Vitals:  Vitals Value Taken Time  BP 143/96 11/14/22 0919  Temp 36.5 C 11/14/22 0918  Pulse 90 11/14/22 0925  Resp 19 11/14/22 0925  SpO2 100 % 11/14/22 0925  Vitals shown include unvalidated device data.  Last Pain:  Vitals:   11/14/22 0542  TempSrc: Oral         Complications: No notable events documented.

## 2022-11-14 NOTE — H&P (Signed)
PREOPERATIVE H&P  Chief Complaint: Left subtalar arthrosis in the setting of cerebral palsy and weakness  HPI: Julia Anderson is a 37 y.o. female who presents for preoperative history and physical with a diagnosis of left foot pain and varus deformity.  She is here today for subtalar arthrodesis given imaging findings and clinical exam consistent with subtalar arthritis and hindfoot instability due to cerebral palsy. Symptoms are rated as moderate to severe, and have been worsening.  This is significantly impairing activities of daily living.  She has elected for surgical management.   Past Medical History:  Diagnosis Date   Allergy    Anxiety    Asthma    Cerebral palsy (HCC)    Depression    Migraine    SVT (supraventricular tachycardia) 2024   Past Surgical History:  Procedure Laterality Date   FOOT CAPSULE RELEASE W/ PERCUTANEOUS HEEL CORD LENGTHENING, TIBIAL TENDON TRANSFER     lengthen a tight heel cord left leg   Social History   Socioeconomic History   Marital status: Single    Spouse name: Not on file   Number of children: 0   Years of education: Not on file   Highest education level: Not on file  Occupational History   Occupation: Engineer, materials    Employer: [employer]  Tobacco Use   Smoking status: Never   Smokeless tobacco: Never  Vaping Use   Vaping Use: Never used  Substance and Sexual Activity   Alcohol use: Yes    Alcohol/week: 1.0 standard drink of alcohol    Types: 1 Glasses of wine per week    Comment: rare   Drug use: No   Sexual activity: Yes    Partners: Male  Other Topics Concern   Not on file  Social History Narrative   Patient lives at home with her grandparents. Patient works full time at [employer] some college.   Right handed.   Caffeine one cup of coffee daily .   Social Determinants of Health   Financial Resource Strain: Not on file  Food Insecurity: Not on file  Transportation Needs: Not on file  Physical  Activity: Not on file  Stress: Not on file  Social Connections: Not on file   Family History  Problem Relation Age of Onset   Suicidality Mother    Alcohol abuse Mother    Drug abuse Mother    Depression Mother    Anxiety disorder Mother    Cirrhosis Father    Cancer Paternal Aunt        breast   Diabetes Paternal Aunt    Diabetes Paternal Grandfather    Cancer Paternal Grandfather        prostate   Allergies  Allergen Reactions   Phentermine Other (See Comments)    Irritability   Citalopram Nausea And Vomiting   Escitalopram Nausea And Vomiting   Fluvoxamine Other (See Comments)    Suicidal Ideation     Iodinated Contrast Media Other (See Comments)     Desc: Pt had reaction to IV cm of itching and breathing heavy at [facility] back in 2006. Given benadryl at the time by Mom.     Iohexol      Desc: Pt had reaction to IV cm of itching and breathing heavy at [employer] back in 2006. Given benadryl at the time by Mom.    Other Other (See Comments)    Cat and pollen-itching   Sertraline Other (See Comments)    Irritability  Wellbutrin [Bupropion] Nausea And Vomiting   Prior to Admission medications   Medication Sig Start Date End Date Taking? Authorizing Provider  cetirizine (ZYRTEC) 10 MG tablet Take 10 mg by mouth daily.   Yes [provider]  drospirenone-ethinyl estradiol (LORYNA) 3-0.02 MG tablet Take 1 tablet by mouth daily. 11/21/21  Yes   Blood Pressure Monitoring (OMRON 3 SERIES BP MONITOR) DEVI Use as directed 02/05/22     diltiazem (CARDIZEM) 30 MG tablet Take 1 tablet (30 mg total) by mouth 3 (three) times daily as needed. 03/15/22   Patwardhan, Reynold Bowen, MD  metoprolol succinate (TOPROL-XL) 25 MG 24 hr tablet Take 1 tablet (25 mg total) by mouth daily. Take with or immediately following a meal. 11/11/22 11/06/23  Patwardhan, Reynold Bowen, MD     Positive ROS: All other systems have been reviewed and were otherwise negative with the exception of those mentioned in  the HPI and as above.  Physical Exam:  Vitals:   11/14/22 0542  BP: (!) 131/95  Pulse: 81  Resp: 18  Temp: 97.6 F (36.4 C)  SpO2: 98%   General: Alert, no acute distress Cardiovascular: No pedal edema Respiratory: No cyanosis, no use of accessory musculature GI: No organomegaly, abdomen is soft and non-tender Skin: No lesions in the area of chief complaint Neurologic: Sensation intact distally Psychiatric: Patient is competent for consent with normal mood and affect Lymphatic: No axillary or cervical lymphadenopathy  MUSCULOSKELETAL: Left foot demonstrates hypermobile subtalar joint with tenderness to palpation overlying the sinus Tarsi.  No gross ankle instability notable on exam.  Weakness with hindfoot eversion testing due to underlying cerebral palsy. SILT.  Foot is WWP  Assessment: Left subtalar arthritis with instability in the setting of cerebral palsy   Plan: Plan for subtalar arthrodesis.  We had a lengthy discussion.  This will not assist with regaining strength but will stabilize her foot and normal neutral valgus position to avoid hindfoot inversion difficulty with ambulating due to underlying weakness.  I do not feel like releasing the posterior tibial tendon is necessary as this would decrease her plantarflexion strength ultimately.  Patient will likely be discharged from the PACU..  We discussed the risks, benefits and alternatives of surgery which include but are not limited to wound healing complications, infection, nonunion, malunion, need for further surgery, damage to surrounding structures and continued pain.  They understand there is no guarantees to an acceptable outcome.  After weighing these risks they opted to proceed with surgery.     Erle Crocker, MD    11/14/2022 7:05 AM

## 2022-11-15 ENCOUNTER — Ambulatory Visit: Payer: Commercial Managed Care - PPO | Admitting: Cardiology

## 2022-11-18 ENCOUNTER — Encounter (HOSPITAL_COMMUNITY): Payer: Self-pay | Admitting: Orthopaedic Surgery

## 2023-02-14 ENCOUNTER — Ambulatory Visit: Payer: Commercial Managed Care - PPO | Admitting: Cardiology

## 2023-02-20 ENCOUNTER — Ambulatory Visit: Payer: Commercial Managed Care - PPO | Admitting: Cardiology

## 2023-05-06 IMAGING — DX DG CHEST 1V PORT
1 series · 1 of 1 positions shown · non-contrast
Comparison: None Available.

CLINICAL DATA: Cough.

EXAM:
PORTABLE CHEST 1 VIEW

[chest ap]
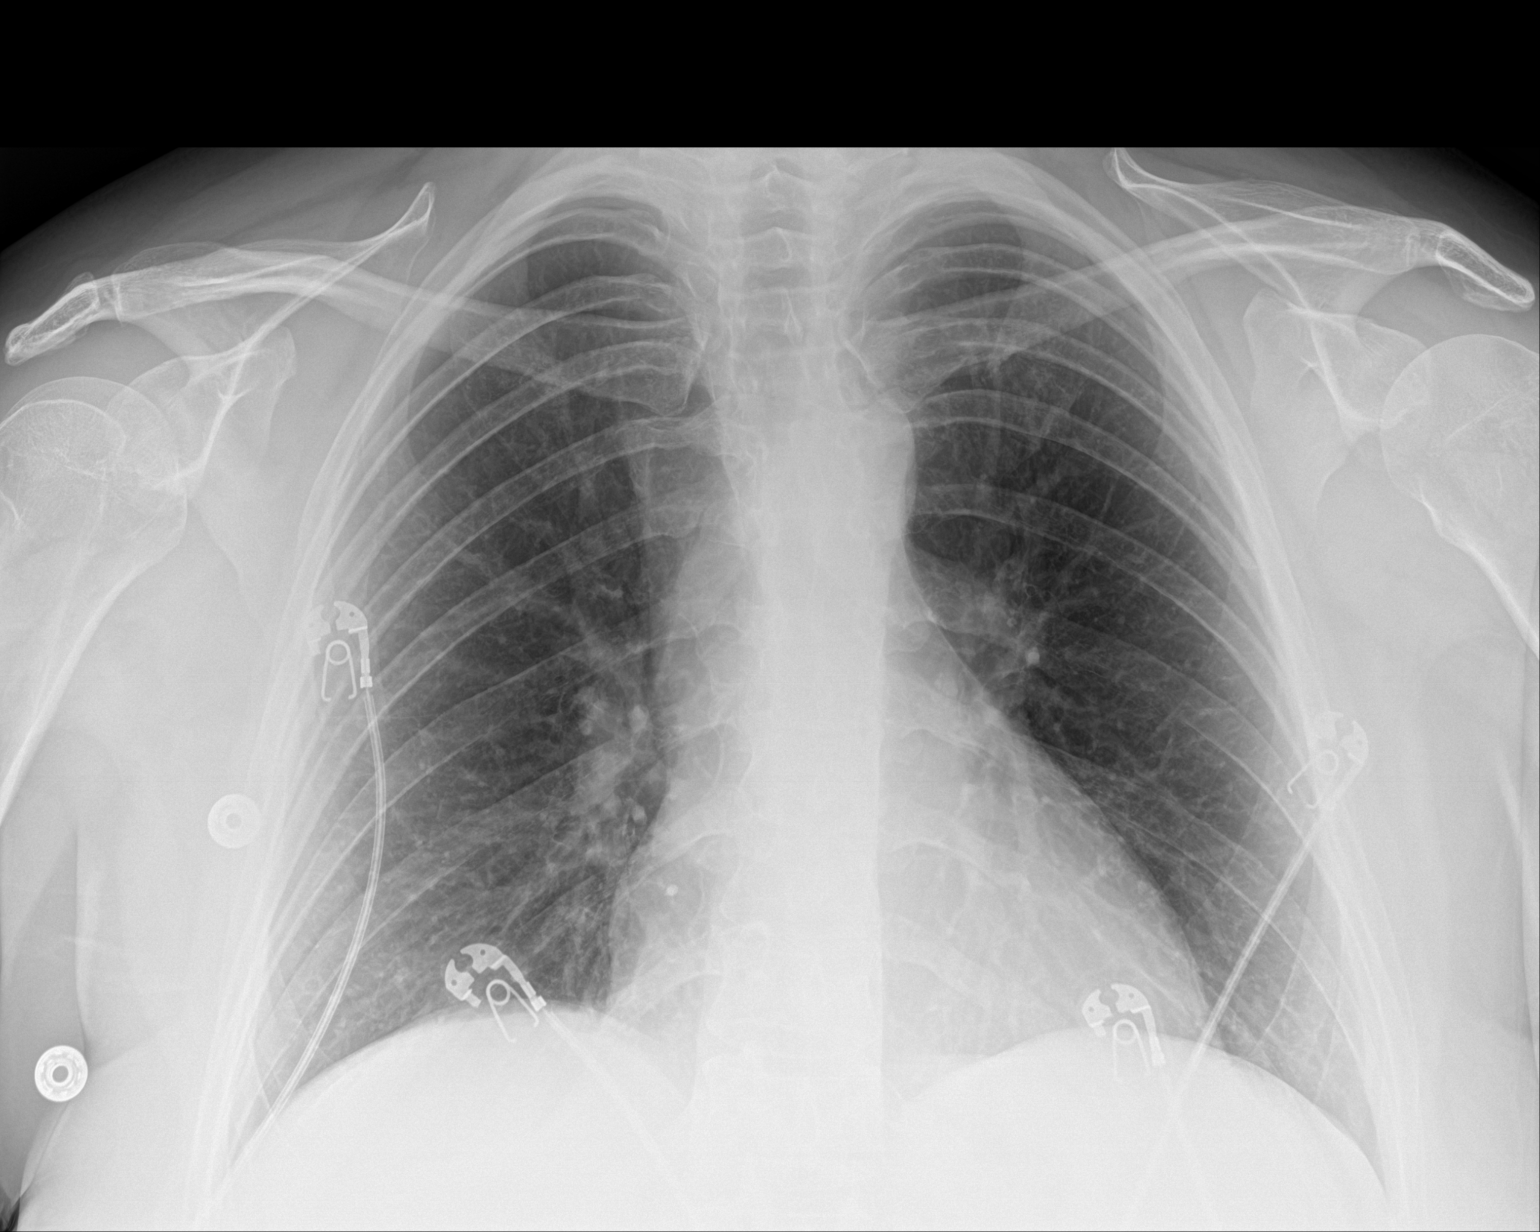

[1 of 1 positions shown; findings below may reference images not displayed]

FINDINGS: No consolidation. No visible pleural effusions or pneumothorax.
Cardiomediastinal silhouette is within normal limits. No displaced
fracture.
IMPRESSION: No evidence of acute cardiopulmonary disease.

## 2023-07-19 ENCOUNTER — Encounter (HOSPITAL_BASED_OUTPATIENT_CLINIC_OR_DEPARTMENT_OTHER): Payer: Self-pay | Admitting: Emergency Medicine

## 2023-07-19 ENCOUNTER — Emergency Department (HOSPITAL_BASED_OUTPATIENT_CLINIC_OR_DEPARTMENT_OTHER)
Admission: EM | Admit: 2023-07-19 | Discharge: 2023-07-19 | Disposition: A | Discharge status: Home / Self Care | Payer: Commercial Managed Care - PPO | Attending: Emergency Medicine | Admitting: Emergency Medicine

## 2023-07-19 ENCOUNTER — Other Ambulatory Visit: Payer: Self-pay

## 2023-07-19 DIAGNOSIS — Z7982 Long term (current) use of aspirin: Secondary | ICD-10-CM | POA: Diagnosis not present

## 2023-07-19 DIAGNOSIS — R5383 Other fatigue: Secondary | ICD-10-CM | POA: Insufficient documentation

## 2023-07-19 DIAGNOSIS — R030 Elevated blood-pressure reading, without diagnosis of hypertension: Secondary | ICD-10-CM | POA: Insufficient documentation

## 2023-07-19 DIAGNOSIS — R519 Headache, unspecified: Secondary | ICD-10-CM | POA: Diagnosis not present

## 2023-07-19 LAB — COMPREHENSIVE METABOLIC PANEL
ALT: 28 U/L (ref 0–44)
AST: 25 U/L (ref 15–41)
Albumin: 3.8 g/dL (ref 3.5–5.0)
Alkaline Phosphatase: 65 U/L (ref 38–126)
Anion gap: 11 (ref 5–15)
BUN: 9 mg/dL (ref 6–20)
CO2: 21 mmol/L — ABNORMAL LOW (ref 22–32)
Calcium: 8.9 mg/dL (ref 8.9–10.3)
Chloride: 105 mmol/L (ref 98–111)
Creatinine, Ser: 0.68 mg/dL (ref 0.44–1.00)
GFR, Estimated: >60 mL/min (ref >60)
Glucose, Bld: 104 mg/dL — ABNORMAL HIGH (ref 70–99)
Potassium: 3.6 mmol/L (ref 3.5–5.1)
Sodium: 137 mmol/L (ref 135–145)
Total Bilirubin: 0.5 mg/dL (ref 0.3–1.2)
Total Protein: 7.7 g/dL (ref 6.5–8.1)

## 2023-07-19 LAB — URINALYSIS, ROUTINE W REFLEX MICROSCOPIC
Bilirubin Urine: NEGATIVE
Glucose, UA: NEGATIVE mg/dL
Hgb urine dipstick: NEGATIVE
Ketones, ur: NEGATIVE mg/dL
Nitrite: NEGATIVE
Protein, ur: NEGATIVE mg/dL
Specific Gravity, Urine: 1.02 (ref 1.005–1.030)
pH: 7 (ref 5.0–8.0)

## 2023-07-19 LAB — CBC WITH DIFFERENTIAL/PLATELET
Abs Immature Granulocytes: 0.01 10*3/uL (ref 0.00–0.07)
Basophils Absolute: 0 10*3/uL (ref 0.0–0.1)
Basophils Relative: 0 %
Eosinophils Absolute: 0.1 10*3/uL (ref 0.0–0.5)
Eosinophils Relative: 3 %
HCT: 42 % (ref 36.0–46.0)
Hemoglobin: 14.3 g/dL (ref 12.0–15.0)
Immature Granulocytes: 0 %
Lymphocytes Relative: 30 %
Lymphs Abs: 1.4 10*3/uL (ref 0.7–4.0)
MCH: 32.1 pg (ref 26.0–34.0)
MCHC: 34 g/dL (ref 30.0–36.0)
MCV: 94.2 fL (ref 80.0–100.0)
Monocytes Absolute: 0.3 10*3/uL (ref 0.1–1.0)
Monocytes Relative: 6 %
Neutro Abs: 2.8 10*3/uL (ref 1.7–7.7)
Neutrophils Relative %: 61 %
Platelets: 243 10*3/uL (ref 150–400)
RBC: 4.46 MIL/uL (ref 3.87–5.11)
RDW: 12.8 % (ref 11.5–15.5)
WBC: 4.7 10*3/uL (ref 4.0–10.5)
nRBC: 0 % (ref 0.0–0.2)

## 2023-07-19 LAB — URINALYSIS, MICROSCOPIC (REFLEX): RBC / HPF: NONE SEEN RBC/hpf (ref 0–5)

## 2023-07-19 LAB — PREGNANCY, URINE: Preg Test, Ur: NEGATIVE

## 2023-07-19 MED ORDER — ACETAMINOPHEN 500 MG PO TABS
1000.0000 mg | ORAL_TABLET | Freq: Once | ORAL | Status: AC
  Administered 2023-07-19: 1000 mg via ORAL
  Filled 2023-07-19: qty 2

## 2023-07-19 NOTE — ED Provider Notes (Signed)
[place] [facility] Provider Note   CSN: [id number] Arrival date & time: 07/19/23  1602     History  Chief Complaint  Patient presents with   Fatigue    Julia Anderson is a 37 y.o. female.  Patient is a 37 year old female with a history of SVT, cerebral palsy and asthma who presents with fatigue and high blood pressure.  She said over the last week she has had some intermittent bifrontal type headaches as well as fatigue.  She says she has not been sleeping very well.  She has been under a lot of stress since she works 2 jobs.  She says her blood pressures been running high in the 150s to 160s systolic.  1 time it was 180 systolic.  She previously was on metoprolol for SVT and her blood pressure.  Although she cut back on her caffeine and she has not had any further episodes of SVT so she stopped taking her metoprolol.  She does say that she had an appointment with her primary care doctor a few months ago and her blood pressure was normal.  She denies any chest pain or shortness of breath.  No urinary symptoms.  No fevers.  No cough or cold symptoms.  No vomiting or diarrhea.  No abdominal pain.  Her headaches are behind her eyes and intermittent.  No other associated symptoms.       Home Medications Prior to Admission medications   Medication Sig Start Date End Date Taking? Authorizing Provider  aspirin (BAYER ASPIRIN) 325 MG tablet Take 1 tablet (325 mg total) by mouth daily for 30 days for blood clot prevention 11/14/22   [place], Jesse J, PA-C  Blood Pressure Monitoring (OMRON 3 SERIES BP MONITOR) DEVI Use as directed 02/05/22     cetirizine (ZYRTEC) 10 MG tablet Take 10 mg by mouth daily.    [provider]  diltiazem (CARDIZEM) 30 MG tablet Take 1 tablet (30 mg total) by mouth 3 (three) times daily as needed. 03/15/22   Patwardhan, Anabel Bene, MD  drospirenone-ethinyl estradiol (LORYNA) 3-0.02 MG tablet Take 1 tablet by mouth daily.  11/21/21     metoprolol succinate (TOPROL-XL) 25 MG 24 hr tablet Take 1 tablet (25 mg total) by mouth daily. Take with or immediately following a meal. 11/11/22 11/06/23  Patwardhan, Anabel Bene, MD  oxyCODONE (ROXICODONE) 5 MG immediate release tablet Take 1 tablet (5 mg total) by mouth every 4 (four) hours as needed (for pain). 11/14/22   [place], Jesse J, PA-C      Allergies    Phentermine, Citalopram, Escitalopram, Fluvoxamine, Iodinated contrast media, Iohexol, Other, Sertraline, and Wellbutrin [bupropion]    Review of Systems   Review of Systems  Constitutional:  Positive for fatigue. Negative for chills, diaphoresis and fever.  HENT:  Negative for congestion, rhinorrhea and sneezing.   Eyes: Negative.   Respiratory:  Negative for cough, chest tightness and shortness of breath.   Cardiovascular:  Negative for chest pain and leg swelling.  Gastrointestinal:  Negative for abdominal pain, blood in stool, diarrhea, nausea and vomiting.  Genitourinary:  Negative for difficulty urinating, flank pain, frequency and hematuria.  Musculoskeletal:  Negative for arthralgias and back pain.  Skin:  Negative for rash.  Neurological:  Positive for headaches. Negative for dizziness, speech difficulty, weakness and numbness.    Physical Exam Updated Vital Signs BP 136/70 (BP Location: Right Arm)   Pulse 100   Temp 98.2 F (36.8 C) (Oral)  Resp 18   Ht 5\' 9"  (1.753 m)   Wt 106.6 kg   SpO2 100%   BMI 34.70 kg/m  Physical Exam Constitutional:      Appearance: She is well-developed.  HENT:     Head: Normocephalic and atraumatic.  Eyes:     Pupils: Pupils are equal, round, and reactive to light.  Cardiovascular:     Rate and Rhythm: Normal rate and regular rhythm.     Heart sounds: Normal heart sounds.  Pulmonary:     Effort: Pulmonary effort is normal. No respiratory distress.     Breath sounds: Normal breath sounds. No wheezing or rales.  Chest:     Chest wall: No tenderness.  Abdominal:      General: Bowel sounds are normal.     Palpations: Abdomen is soft.     Tenderness: There is no abdominal tenderness. There is no guarding or rebound.  Musculoskeletal:        General: Normal range of motion.     Cervical back: Normal range of motion and neck supple.  Lymphadenopathy:     Cervical: No cervical adenopathy.  Skin:    General: Skin is warm and dry.     Findings: No rash.  Neurological:     General: No focal deficit present.     Mental Status: She is alert and oriented to person, place, and time.     ED Results / Procedures / Treatments   Labs (all labs ordered are listed, but only abnormal results are displayed) Labs Reviewed  COMPREHENSIVE METABOLIC PANEL - Abnormal; Notable for the following components:      Result Value   CO2 21 (*)    Glucose, Bld 104 (*)    All other components within normal limits  URINALYSIS, ROUTINE W REFLEX MICROSCOPIC - Abnormal; Notable for the following components:   Leukocytes,Ua SMALL (*)    All other components within normal limits  URINALYSIS, MICROSCOPIC (REFLEX) - Abnormal; Notable for the following components:   Bacteria, UA FEW (*)    All other components within normal limits  CBC WITH DIFFERENTIAL/PLATELET  PREGNANCY, URINE    EKG None  Radiology No results found.  Procedures Procedures    Medications Ordered in ED Medications  acetaminophen (TYLENOL) tablet 1,000 mg (1,000 mg Oral Given 07/19/23 2237)    ED Course/ Medical Decision Making/ A&P                                 Medical Decision Making Amount and/or Complexity of Data Reviewed Labs: ordered.  Risk OTC drugs.   Patient is a 37 year old female who presents with concerns that her blood pressure is elevated.  She also has had some general fatigue and some intermittent bifrontal type headaches.  No neurologic deficits.  No chest pain or shortness of breath.  No other symptoms concerning for ACS.  Her EKG does not show any ischemic changes.  Her  labs are reviewed and are nonconcerning.  She does not have any urinary symptoms.  No signs of infection.  No signs of dehydration.  No neurologic deficits or concerns for stroke.  Her blood pressure was checked manually and it was 136/70.  At this point I do not feel that there is an indication to start antihypertensive medications.  She has an upcoming appointment with her primary care doctor.  I discussed keeping a log of her blood pressures and following up with  her primary care doctor with that information.  She was discharged home in good condition.  Return precautions were given.  Final Clinical Impression(s) / ED Diagnoses Final diagnoses:  Fatigue, unspecified type  Elevated blood pressure reading  Acute nonintractable headache, unspecified headache type    Rx / DC Orders ED Discharge Orders     None         Rolan Bucco, MD 07/19/23 2309

## 2023-07-19 NOTE — Discharge Instructions (Addendum)
Follow-up with your primary care doctor.  Return to the emergency room if you have any worsening symptoms

## 2023-07-19 NOTE — ED Triage Notes (Signed)
Pt sts she's recently had fatigue, but she also has trouble sleeping; took herself off BP meds months ago; checked it at [facility] today and it was elevated

## 2023-07-29 ENCOUNTER — Telehealth: Payer: Self-pay | Admitting: Cardiology

## 2023-07-29 NOTE — Telephone Encounter (Signed)
Will see her tomorrow.  Thanks MJP

## 2023-07-29 NOTE — Telephone Encounter (Signed)
Call transferred into triage.  The patient reports for the past two weeks bps have been going up and then back down to normal ex: 182/112 -inside the [employer], then while sitting in the ER later - back down to 120s/80s.  Additionally she is noting her HR jumping up to 140s, 170s.  It gradually comes back down in 2-3 min per the Apple watch.  Heart is pounding and causes her anxiety,    2 weeks ago she came off her birth control.   Having hormones checked tomorrow.   She had Toprol XL but never used "because BP was fine until recently". She just started it a few days ago but has reduced herself to 12.5 mg due to how fatigued it makes her. PCP prescribed hydrochlorothiazide in am, (unsure if 12.5 or 25 mg) and to take the Toprol at bedtime.  She wants to talk to Dr. Rosemary Holms first.  History of SVT.  Has not used cardizem.   Scheduled in office visit tomorrow.

## 2023-07-29 NOTE — Telephone Encounter (Signed)
STAT if HR is under 50 or over 120 (normal HR is 60-100 beats per minute)  What is your heart rate? 170  Do you have a log of your heart rate readings (document readings)? Yes   Do you have any other symptoms? BP has been going up and down

## 2023-07-30 ENCOUNTER — Ambulatory Visit: Payer: Commercial Managed Care - PPO | Attending: Cardiology | Admitting: Cardiology

## 2023-07-30 ENCOUNTER — Encounter: Payer: Self-pay | Admitting: Cardiology

## 2023-07-30 VITALS — BP 128/88 | HR 97 | Resp 16 | Ht 69.0 in | Wt 244.8 lb

## 2023-07-30 DIAGNOSIS — R030 Elevated blood-pressure reading, without diagnosis of hypertension: Secondary | ICD-10-CM | POA: Insufficient documentation

## 2023-07-30 DIAGNOSIS — I471 Supraventricular tachycardia, unspecified: Secondary | ICD-10-CM | POA: Diagnosis not present

## 2023-07-30 MED ORDER — DILTIAZEM HCL ER COATED BEADS 120 MG PO CP24: 120.0000 mg | ORAL_CAPSULE | Freq: Every day | ORAL | 2 refills | Status: DC

## 2023-07-30 MED ORDER — METOPROLOL SUCCINATE ER 25 MG PO TB24: 25.0000 mg | ORAL_TABLET | Freq: Every day | ORAL | 2 refills | Status: DC

## 2023-07-30 NOTE — Patient Instructions (Signed)
Medication Instructions:  START DILTIAZEM CD 120 MG A DAY  START METOPROLOL SUCCINATE 25 MG A DAY   *If you need a refill on your cardiac medications before your next appointment, please call your pharmacy*   Lab Work:  If you have labs (blood work) drawn today and your tests are completely normal, you will receive your results only by: MyChart Message (if you have MyChart) OR A paper copy in the mail If you have any lab test that is abnormal or we need to change your treatment, we will call you to review the results.   Testing/Procedures:    Follow-Up: At [facility], you and your health needs are our priority.  As part of our continuing mission to provide you with exceptional heart care, we have created designated Provider Care Teams.  These Care Teams include your primary Cardiologist (physician) and Advanced Practice Providers (APPs -  Physician Assistants and Nurse Practitioners) who all work together to provide you with the care you need, when you need it.  We recommend signing up for the patient portal called "MyChart".  Sign up information is provided on this After Visit Summary.  MyChart is used to connect with patients for Virtual Visits (Telemedicine).  Patients are able to view lab/test results, encounter notes, upcoming appointments, etc.  Non-urgent messages can be sent to your provider as well.   To learn more about what you can do with MyChart, go to [email or web address].    Your next appointment:   APP IN 4-6 WEEKS DR PATWARDHAN IN 6 MONTHS

## 2023-07-30 NOTE — Progress Notes (Signed)
Cardiology Office Note:  .   Date:  07/30/2023  ID:  Julia Anderson, DOB Mar 01, 1986, MRN [id number] PCP: Norm Salt, PA  [facility] Providers Cardiologist:  Truett Mainland, MD PCP: Norm Salt, [place]  Chief Complaint  Patient presents with   PSVT (paroxysmal supraventricular tachycardia)   Follow-up    6 months      History of Present Illness: .   Julia Anderson is a 37 y.o. female with cerebral palsy, PSVT, elevated blood pressure without diagnosis of hypertension  Recently, patient has had episodes of elevated blood pressure as high as 180/110 mmHg.  Sometimes, she is also noticed episodes of rapid heartbeat with heart rate up to 160 bpm.  She has been under a lot of stress lately.  She does correlate her elevated blood pressure to discontinuation of her oral contraceptive pills.  She has few other symptoms of hot flashes etc.  She is going to see OB/GYN soon.   Vitals:   07/30/23 1536  BP: 128/88  Pulse: 97  Resp: 16  SpO2: 98%     ROS:  Review of Systems  Cardiovascular:  Positive for palpitations. Negative for chest pain, dyspnea on exertion, leg swelling and syncope.  Psychiatric/Behavioral:  The patient is nervous/anxious.      Studies Reviewed: Marland Kitchen       Labs 06/2023, essentially normal.   Physical Exam:   Physical Exam Vitals and nursing note reviewed.  Constitutional:      General: She is not in acute distress. Neck:     Vascular: No JVD.  Cardiovascular:     Rate and Rhythm: Normal rate and regular rhythm.     Heart sounds: Normal heart sounds. No murmur heard. Pulmonary:     Effort: Pulmonary effort is normal.     Breath sounds: Normal breath sounds. No wheezing or rales.  Musculoskeletal:     Right lower leg: No edema.     Left lower leg: No edema.      VISIT DIAGNOSES:   ICD-10-CM   1. Elevated blood pressure reading without diagnosis of hypertension  R03.0     2. PSVT (paroxysmal supraventricular  tachycardia) (HCC)  I47.10        ASSESSMENT AND PLAN: .    Julia Anderson is a 37 y.o. female with cerebral palsy, PSVT, elevated blood pressure without diagnosis of hypertension   PSVT: Episodes in May 2023, December 2020, January 2024. 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   However, symptoms are fairly controlled with medications alone. Continue Medrol succinate 25 mg daily.  With recent increase in in blood pressure, particular diastolic, also added diltiazem 120 mg daily. If increase in severity and frequency of symptoms, could consider referral to EP for ablation.   Separately, also encouraged her to follow-up with her OB/GYN due to her hot flashes.  I do think anxiety and stress is also playing a role in her symptoms.  Follow-up with APP in 6 weeks to monitor hypertension.  F/u w/me in 6 months  Signed, Elder Negus, MD

## 2023-08-05 ENCOUNTER — Telehealth: Payer: Self-pay | Admitting: Cardiology

## 2023-08-05 ENCOUNTER — Encounter (HOSPITAL_BASED_OUTPATIENT_CLINIC_OR_DEPARTMENT_OTHER): Payer: Self-pay | Admitting: Pediatrics

## 2023-08-05 ENCOUNTER — Other Ambulatory Visit: Payer: Self-pay

## 2023-08-05 ENCOUNTER — Emergency Department (HOSPITAL_BASED_OUTPATIENT_CLINIC_OR_DEPARTMENT_OTHER): Payer: Commercial Managed Care - PPO

## 2023-08-05 ENCOUNTER — Emergency Department (HOSPITAL_BASED_OUTPATIENT_CLINIC_OR_DEPARTMENT_OTHER)
Admission: EM | Admit: 2023-08-05 | Discharge: 2023-08-05 | Disposition: A | Discharge status: Home / Self Care | Payer: Commercial Managed Care - PPO | Attending: Emergency Medicine | Admitting: Emergency Medicine

## 2023-08-05 DIAGNOSIS — F419 Anxiety disorder, unspecified: Secondary | ICD-10-CM | POA: Insufficient documentation

## 2023-08-05 DIAGNOSIS — J45909 Unspecified asthma, uncomplicated: Secondary | ICD-10-CM | POA: Diagnosis not present

## 2023-08-05 DIAGNOSIS — R079 Chest pain, unspecified: Secondary | ICD-10-CM

## 2023-08-05 DIAGNOSIS — R0789 Other chest pain: Secondary | ICD-10-CM | POA: Insufficient documentation

## 2023-08-05 LAB — CBC
HCT: 41.8 % (ref 36.0–46.0)
Hemoglobin: 14.5 g/dL (ref 12.0–15.0)
MCH: 32.3 pg (ref 26.0–34.0)
MCHC: 34.7 g/dL (ref 30.0–36.0)
MCV: 93.1 fL (ref 80.0–100.0)
Platelets: 241 10*3/uL (ref 150–400)
RBC: 4.49 MIL/uL (ref 3.87–5.11)
RDW: 12.4 % (ref 11.5–15.5)
WBC: 4.2 10*3/uL (ref 4.0–10.5)
nRBC: 0 % (ref 0.0–0.2)

## 2023-08-05 LAB — BASIC METABOLIC PANEL
Anion gap: 10 (ref 5–15)
BUN: 11 mg/dL (ref 6–20)
CO2: 22 mmol/L (ref 22–32)
Calcium: 8.8 mg/dL — ABNORMAL LOW (ref 8.9–10.3)
Chloride: 104 mmol/L (ref 98–111)
Creatinine, Ser: 0.92 mg/dL (ref 0.44–1.00)
GFR, Estimated: >60 mL/min (ref >60)
Glucose, Bld: 118 mg/dL — ABNORMAL HIGH (ref 70–99)
Potassium: 3.7 mmol/L (ref 3.5–5.1)
Sodium: 136 mmol/L (ref 135–145)

## 2023-08-05 LAB — PREGNANCY, URINE: Preg Test, Ur: NEGATIVE

## 2023-08-05 LAB — TROPONIN I (HIGH SENSITIVITY): Troponin I (High Sensitivity): <2 ng/L (ref <18)

## 2023-08-05 LAB — MAGNESIUM: Magnesium: 2 mg/dL (ref 1.7–2.4)

## 2023-08-05 LAB — D-DIMER, QUANTITATIVE: D-Dimer, Quant: 0.52 ug{FEU}/mL — ABNORMAL HIGH (ref 0.00–0.50)

## 2023-08-05 MED ORDER — DIPHENHYDRAMINE HCL 50 MG/ML IJ SOLN
50.0000 mg | Freq: Once | INTRAMUSCULAR | Status: AC
  Administered 2023-08-05: 50 mg via INTRAVENOUS
  Filled 2023-08-05: qty 1

## 2023-08-05 MED ORDER — DIPHENHYDRAMINE HCL 25 MG PO CAPS: 50.0000 mg | ORAL_CAPSULE | Freq: Once | ORAL | Status: DC

## 2023-08-05 MED ORDER — DIPHENHYDRAMINE HCL 50 MG/ML IJ SOLN: 50.0000 mg | Freq: Once | INTRAMUSCULAR | Status: DC

## 2023-08-05 MED ORDER — LORAZEPAM 2 MG/ML IJ SOLN
1.0000 mg | Freq: Once | INTRAMUSCULAR | Status: AC
  Administered 2023-08-05: 1 mg via INTRAVENOUS
  Filled 2023-08-05: qty 1

## 2023-08-05 MED ORDER — IOHEXOL 350 MG/ML SOLN
100.0000 mL | Freq: Once | INTRAVENOUS | Status: AC | PRN
  Administered 2023-08-05: 100 mL via INTRAVENOUS

## 2023-08-05 MED ORDER — METHYLPREDNISOLONE SODIUM SUCC 40 MG IJ SOLR
40.0000 mg | Freq: Once | INTRAMUSCULAR | Status: AC
  Administered 2023-08-05: 40 mg via INTRAVENOUS
  Filled 2023-08-05: qty 1

## 2023-08-05 MED ORDER — DIPHENHYDRAMINE HCL 25 MG PO CAPS: 50.0000 mg | ORAL_CAPSULE | Freq: Once | ORAL | Status: AC

## 2023-08-05 NOTE — Telephone Encounter (Signed)
Spoke with patient and she would like for you to review labs done by OB/GYN and see if she will need to make changes to any of her cardiac medications.

## 2023-08-05 NOTE — ED Provider Notes (Addendum)
[facility] Provider Note   CSN: [id number] Arrival date & time: 08/05/23  1309     History  Chief Complaint  Patient presents with   Chest Pain    Julia Anderson is a 37 y.o. female.   Chest Pain Associated symptoms: shortness of breath      37 year old female with medical history significant for asthma, cerebral palsy, anxiety, migraine headaches, SVT on metoprolol, depression, presenting to the emergency department with intermittent chest discomfort and heart palpitations.  The patient states that for the last 3 weeks she has had episodes of high blood pressure and she is being worked up outpatient for an elevated cortisol level and has a referral to endocrinology.  She states that she has had intermittent sharp chest discomfort with associated shortness of breath over the last few weeks.  She presents to the emergency department today because she developed sharp chest pain.  She has had episodes of palpitations for which she is on metoprolol outpatient.  He takes 25 mg of metoprolol daily she is unsure if she is having episodes of panic attacks with chest pressure, palpitations and shortness of breath.  Today she feels sharp pleuritic chest discomfort on the left side, does not feel like reflux, no burning sensation, not worse when lying flat.  No fevers, chills or cough.  No lower extremity swelling.  She feels anxious.  Home Medications Prior to Admission medications   Medication Sig Start Date End Date Taking? Authorizing Provider  ascorbic acid (VITAMIN C) 500 MG tablet Take 500 mg by mouth daily. 05/02/20   [provider]  Blood Pressure Monitoring (OMRON 3 SERIES BP MONITOR) DEVI Use as directed 02/05/22     cetirizine (ZYRTEC) 10 MG tablet Take 10 mg by mouth daily.    [provider]  diltiazem (CARDIZEM CD) 120 MG 24 hr capsule Take 1 capsule (120 mg total) by mouth daily. 07/30/23   Patwardhan, Anabel Bene, MD   diltiazem (CARDIZEM) 30 MG tablet Take 1 tablet (30 mg total) by mouth 3 (three) times daily as needed. Patient not taking: Reported on 07/30/2023 03/15/22   Elder Negus, MD  drospirenone-ethinyl estradiol (LORYNA) 3-0.02 MG tablet Take 1 tablet by mouth daily. 11/21/21     metoprolol succinate (TOPROL-XL) 25 MG 24 hr tablet Take 1 tablet (25 mg total) by mouth daily. Take with or immediately following a meal. 07/30/23   Patwardhan, Manish J, MD  Multiple Vitamin (MULTI-VITAMIN) tablet Take 1 tablet by mouth daily. 01/27/16   [provider]      Allergies    Phentermine, Citalopram, Escitalopram, Fluvoxamine, Iodinated contrast media, Iohexol, Other, Sertraline, and Wellbutrin [bupropion]    Review of Systems   Review of Systems  Respiratory:  Positive for shortness of breath.   Cardiovascular:  Positive for chest pain.  All other systems reviewed and are negative.   Physical Exam Updated Vital Signs BP 126/80   Pulse 81   Temp 97.8 F (36.6 C)   Resp 19   Ht 5\' 9"  (1.753 m)   Wt 111 kg   SpO2 95%   BMI 36.15 kg/m  Physical Exam Vitals and nursing note reviewed.  Constitutional:      General: She is not in acute distress.    Appearance: She is well-developed.  HENT:     Head: Normocephalic and atraumatic.  Eyes:     Conjunctiva/sclera: Conjunctivae normal.  Cardiovascular:     Rate and Rhythm:  Normal rate and regular rhythm.  Pulmonary:     Effort: Pulmonary effort is normal. No respiratory distress.     Breath sounds: Normal breath sounds.  Abdominal:     Palpations: Abdomen is soft.     Tenderness: There is no abdominal tenderness.  Musculoskeletal:        General: No swelling.     Cervical back: Neck supple.     Right lower leg: No edema.     Left lower leg: No edema.  Skin:    General: Skin is warm and dry.     Capillary Refill: Capillary refill takes less than 2 seconds.  Neurological:     Mental Status: She is alert.  Psychiatric:         Mood and Affect: Mood normal.     ED Results / Procedures / Treatments   Labs (all labs ordered are listed, but only abnormal results are displayed) Labs Reviewed  BASIC METABOLIC PANEL - Abnormal; Notable for the following components:      Result Value   Glucose, Bld 118 (*)    Calcium 8.8 (*)    All other components within normal limits  D-DIMER, QUANTITATIVE - Abnormal; Notable for the following components:   D-Dimer, Quant 0.52 (*)    All other components within normal limits  PREGNANCY, URINE  CBC  MAGNESIUM  TROPONIN I (HIGH SENSITIVITY)  TROPONIN I (HIGH SENSITIVITY)    EKG EKG Interpretation Date/Time:  Tuesday August 05 2023 13:23:33 EDT Ventricular Rate:  109 PR Interval:  140 QRS Duration:  100 QT Interval:  331 QTC Calculation: 446 R Axis:   136  Text Interpretation: Sinus tachycardia Right axis deviation Borderline T abnormalities, anterior leads Possible [id number] Confirmed by Ernie Avena ([id number]) on 08/05/2023 2:35:26 PM  Radiology CT Angio Chest PE W and/or Wo Contrast  Result Date: 08/05/2023 CLINICAL DATA:  Pulmonary embolism (PE) suspected, low to intermediate prob, positive D-dimer Chest pain. EXAM: CT ANGIOGRAPHY CHEST WITH CONTRAST TECHNIQUE: Multidetector CT imaging of the chest was performed using the standard protocol during bolus administration of intravenous contrast. Multiplanar CT image reconstructions and MIPs were obtained to evaluate the vascular anatomy. Patient received premedication for reported allergy. RADIATION DOSE REDUCTION: This exam was performed according to the departmental dose-optimization program which includes automated exposure control, adjustment of the mA and/or kV according to patient size and/or use of iterative reconstruction technique. CONTRAST:  OMNIPAQUE IOHEXOL 350 MG/ML SOLN COMPARISON:  Radiograph earlier today. FINDINGS: Cardiovascular: There are no filling defects within the pulmonary arteries to suggest pulmonary  embolus. The thoracic aorta is normal in caliber, no acute findings. Normal heart size. No pericardial effusion. Mediastinum/Nodes: No mediastinal or hilar adenopathy. Unremarkable esophagus. No thyroid nodule. Lungs/Pleura: Clear lungs. No pleural fluid or pneumothorax. No pulmonary edema. Upper Abdomen: No acute or unexpected findings. Musculoskeletal: There are no acute or suspicious osseous abnormalities. Slight levo scoliotic curvature. No chest wall soft tissue abnormalities. Review of the MIP images confirms the above findings. IMPRESSION: No pulmonary embolus or acute intrathoracic abnormality. Electronically Signed   By: Narda Rutherford M.D.   On: 08/05/2023 20:57   DG Chest 2 View  Result Date: 08/05/2023 CLINICAL DATA:  Fluctuating blood pressure. EXAM: CHEST - 2 VIEW COMPARISON:  November 04, 2022 FINDINGS: The heart size and mediastinal contours are within normal limits. Both lungs are clear. The visualized skeletal structures are unremarkable. IMPRESSION: No active cardiopulmonary disease. Electronically Signed   By: Aram Candela M.D.   On:  08/05/2023 15:10    Procedures Procedures    Medications Ordered in ED Medications  LORazepam (ATIVAN) injection 1 mg (1 mg Intravenous Given 08/05/23 1604)  methylPREDNISolone sodium succinate (SOLU-MEDROL) 40 mg/mL injection 40 mg (40 mg Intravenous Given 08/05/23 1604)  diphenhydrAMINE (BENADRYL) capsule 50 mg ( Oral See Alternative 08/05/23 1930)    Or  diphenhydrAMINE (BENADRYL) injection 50 mg (50 mg Intravenous Given 08/05/23 1930)  iohexol (OMNIPAQUE) 350 MG/ML injection 100 mL (100 mLs Intravenous Contrast Given 08/05/23 2037)    ED Course/ Medical Decision Making/ A&P             HEART Score: 1                    Medical Decision Making Amount and/or Complexity of Data Reviewed Labs: ordered. Radiology: ordered.  Risk Prescription drug management.     37 year old female with medical history significant for asthma, cerebral  palsy, anxiety, migraine headaches, SVT on metoprolol, depression, presenting to the emergency department with intermittent chest discomfort and heart palpitations.  The patient states that for the last 3 weeks she has had episodes of high blood pressure and she is being worked up outpatient for an elevated cortisol level and has a referral to endocrinology.  She states that she has had intermittent sharp chest discomfort with associated shortness of breath over the last few weeks.  She presents to the emergency department today because she developed sharp chest pain.  She has had episodes of palpitations for which she is on metoprolol outpatient.  He takes 25 mg of metoprolol daily she is unsure if she is having episodes of panic attacks with chest pressure, palpitations and shortness of breath.  Today she feels sharp pleuritic chest discomfort on the left side, does not feel like reflux, no burning sensation, not worse when lying flat.  No fevers, chills or cough.  No lower extremity swelling.  She feels anxious.  Medical Decision Making: ALDINE CHAKRABORTY is a 37 y.o. female who presented to the ED today with chest pain, detailed above.  Based on patient's comorbidities, patient has a heart score of 1.    Patient placed on continuous vitals and telemetry monitoring while in ED which was reviewed periodically.  Complete initial physical exam performed, notably the patient was CTAB, no LE edema.   Reviewed and confirmed nursing documentation for past medical history, family history, social history.    Initial Assessment: With the patient's presentation of left-sided chest pain, most likely diagnosis is musculoskeletal chest pain versus GERD, although ACS remains on the differential. Other diagnoses were considered including (but not limited to) pulmonary embolism, community-acquired pneumonia, aortic dissection, pneumothorax, underlying bony abnormality, anemia. These are considered less likely due to  history of present illness and physical exam findings.    In particular, concerning pulmonary embolism: Patient is PERC positive and the they deny malignancy, recent surgery, history of DVT, or calf tenderness leading to a low risk Wells score. Aortic Dissection also considered but seems less likely based on the location, quality, onset, and severity of symptoms in this case.   Initial Plan: Evaluate for ACS with single troponin and EKG evaluated as below  Evaluate for dissection, bony abnormality, or pneumonia with chest x-ray and screening laboratory evaluation including CBC, BMP  Further evaluation for pulmonary embolism indicated at this time based on patient's PERC and Wells score.  Further evaluation for Thoracic Aortic Dissection =not indicated at this time based on patient's clinical history and  PE findings.   Initial Study Results: =EKG was reviewed independently. Rate, rhythm, axis, intervals all examined and without medically relevant abnormality. ST segments without concerns for elevations.    Laboratory  Single troponin  D-dimer was notably elevated to 0.52.  CBC and BMP without obvious metabolic or inflammatory abnormalities requiring further evaluation   Radiology  CT Angio Chest PE W and/or Wo Contrast  Result Date: 08/05/2023 CLINICAL DATA:  Pulmonary embolism (PE) suspected, low to intermediate prob, positive D-dimer Chest pain. EXAM: CT ANGIOGRAPHY CHEST WITH CONTRAST TECHNIQUE: Multidetector CT imaging of the chest was performed using the standard protocol during bolus administration of intravenous contrast. Multiplanar CT image reconstructions and MIPs were obtained to evaluate the vascular anatomy. Patient received premedication for reported allergy. RADIATION DOSE REDUCTION: This exam was performed according to the departmental dose-optimization program which includes automated exposure control, adjustment of the mA and/or kV according to patient size and/or use of  iterative reconstruction technique. CONTRAST:  OMNIPAQUE IOHEXOL 350 MG/ML SOLN COMPARISON:  Radiograph earlier today. FINDINGS: Cardiovascular: There are no filling defects within the pulmonary arteries to suggest pulmonary embolus. The thoracic aorta is normal in caliber, no acute findings. Normal heart size. No pericardial effusion. Mediastinum/Nodes: No mediastinal or hilar adenopathy. Unremarkable esophagus. No thyroid nodule. Lungs/Pleura: Clear lungs. No pleural fluid or pneumothorax. No pulmonary edema. Upper Abdomen: No acute or unexpected findings. Musculoskeletal: There are no acute or suspicious osseous abnormalities. Slight levo scoliotic curvature. No chest wall soft tissue abnormalities. Review of the MIP images confirms the above findings. IMPRESSION: No pulmonary embolus or acute intrathoracic abnormality. Electronically Signed   By: Narda Rutherford M.D.   On: 08/05/2023 20:57   DG Chest 2 View  Result Date: 08/05/2023 CLINICAL DATA:  Fluctuating blood pressure. EXAM: CHEST - 2 VIEW COMPARISON:  November 04, 2022 FINDINGS: The heart size and mediastinal contours are within normal limits. Both lungs are clear. The visualized skeletal structures are unremarkable. IMPRESSION: No active cardiopulmonary disease. Electronically Signed   By: Aram Candela M.D.   On: 08/05/2023 15:10    Final Assessment and Plan: Patient with reassuring vitals on repeat assessment, CTA imaging was performed following initial premedication due to the patient's history of contrast allergy which showed no evidence of PE or other acute intrathoracic abnormality.   I discussed the remote possibility of an adrenal tumor with the patient that could be secreting cortisol. I also discussed the possibility of a pheochromocytoma with the patient causing her symptoms. The patient does not present with uncontrolled hypertension. She is currently vitally stable. She has already had thyroid function studies outpatient  that were normal. I offered a CT abdomen pelvis to further evaluate to look for possible pheochromocytoma which I feel to be much less likely or an adrenal adenoma, however, the patient declined and preferred to undergo further workup for this outpatient. Regarding her chest discomfort, a troponin was normal and she has had symptoms intermittently for several weeks. Low concern for ACS or other acute intrathoracic abnormality given her workup today. Regarding her anxiety symptoms, I recommended that she follow-up with her PCP for long term management. She did state that she felt symptomatically improved after Ativan today. I recommended that the patient follow-up with endocrinology and her PCP regarding her symptoms for continued outpatient workup.    Final Clinical Impression(s) / ED Diagnoses Final diagnoses:  Chest pain, unspecified type  Anxiety    Rx / DC Orders ED Discharge Orders     None  Ernie Avena, MD 08/05/23 Angela Nevin    Ernie Avena, MD 08/05/23 2326

## 2023-08-05 NOTE — ED Notes (Signed)
Patient transported to CT 

## 2023-08-05 NOTE — Telephone Encounter (Signed)
Patient called to report she had cortisol testing and results was high.  Patient stated her OB-GYN referred her to an endocrinologist.  Patient wants to know if she needs to make any changes to her medication and wants advice on next steps.

## 2023-08-05 NOTE — ED Notes (Signed)
Went to pt. Room because I saw that pt.'s HR was at 125. Pt. Was sitting up and visibly sweating. Observed pt's temp in which she didn't have a fever. Repositioned pt. HR returned to normal limits. EDP made aware

## 2023-08-05 NOTE — Discharge Instructions (Addendum)
Your EKG, CT imaging and laboratory evaluation was overall reassuring.  You had an elevated D-dimer which is nonspecific but your CT imaging was negative for a blood clot.  Recommend you follow-up closely with your primary care provider for further management of your symptoms which could be due to anxiety.

## 2023-08-05 NOTE — ED Triage Notes (Signed)
C/O chest pain x 1 day; reports her cortisol level have been fluctuating the last 3 weeks as well and awaiting endocrinology. States she has hx of high blood pressure as well. Stated she's on diltiazem, metoprolol and amlodipine.

## 2023-08-05 NOTE — ED Notes (Signed)
Providing d/c instructions to patient and visitor at the bedside asked this RN for a rx for patient for anxiety and that the MD had stated he would provide a rx. This RN reiterated that there were no rx's and that patient needed to follow-up with PCP. Visitor appeared frustrated and asked why she is not getting medication for her anxiety and stated that he was unsure if she was given medications during her visit. This RN educated both the patient and the visitor that patient had indeed received medications to help with anxiety and provided times and dosages of the medications given, to include Ativan which was used for anxiety. This RN asked patient if she had a psychiatrist/psychologist who she sees and if her anxiety is a known dx. Patient reported she does not have mental health issues, that her cortisol levels are abnormal and she is awaiting a follow-up with an Endocrinologist. This RN expressed the importance of that follow-up if the anxiety is related to endocrine issues. Spoke with MD Karene Fry and he also reiterated that patient was given anxiety meds during her visit and she would not receive a rx for benzodiazepines. Pt safe for d/c with no repeat troponin.

## 2023-08-06 NOTE — Telephone Encounter (Signed)
Patwardhan, Anabel Bene, MD  Staley, Demetris R, LPN Cc: P Cv Div Ch St Triage Caller: Unspecified (Yesterday, 11:15 AM) I do not see the actual test results of cortisol but gather that it was elevated and endocrinology referral was placed. I also reviewed the ER visit and test results from 10/9. No medication changes needed at this time. Will await endocrinology consultation.  Thanks MJP   Pt aware of the above message per Dr. Rosemary Holms.  Pt aware to proceed with Endocrinology referral, for elevated cortisol level of what she reported to me as being "29."  Pt verbalized understanding and agrees with this plan.    Off note:  Pt wanted to ask our Pharmacist if there would be any contraindications with her current cardiac meds and starting both starting Vitamin C 500 mg and Vit D 1000 Units. Pt is aware I will route this question to our PharmD team for further advisement, and a triage nurse will follow-up with her accordingly thereafter. Pt verbalized understanding and agrees with this plan.

## 2023-08-06 NOTE — Telephone Encounter (Signed)
I do not see the actual test results of cortisol but gather that it was elevated and endocrinology referral was placed. I also reviewed the ER visit and test results from 10/9. No medication changes needed at this time. Will await endocrinology consultation.  Thanks MJP

## 2023-08-06 NOTE — Telephone Encounter (Signed)
Julia Anderson, RPH-CPP  [name], LPN Caller: Unspecified (Yesterday, 11:15 AM) There is no issue with Vit D or C  Pt aware of PharmD response to her medications being safe and starting Vit C & D.   Pt was appreciative for all the assistance provided.

## 2023-08-12 ENCOUNTER — Telehealth: Payer: Self-pay | Admitting: Cardiology

## 2023-08-12 DIAGNOSIS — R072 Precordial pain: Secondary | ICD-10-CM

## 2023-08-12 DIAGNOSIS — R9431 Abnormal electrocardiogram [ECG] [EKG]: Secondary | ICD-10-CM

## 2023-08-12 NOTE — Telephone Encounter (Signed)
Pt called in to inform she has been increased CP. Denies any other symptoms. She seemed also concerned about her cortisol level being high. Please advise.

## 2023-08-12 NOTE — Telephone Encounter (Signed)
Left message to call back  

## 2023-08-12 NOTE — Telephone Encounter (Signed)
If patient prefers, we can do a coronary CTA to rule out cardiac cause for chest pain, but my suspicion is low.  Thanks MJP

## 2023-08-12 NOTE — Telephone Encounter (Signed)
Spoke with patient and she states she is not currently having chest pain. She did go to the ED on October 8th. She would like for you to review the notes. She is having increased anxiety as well because her cortisol levels are which is causing her to be SOB and tightness in her chest. Did advise she should reach out to her PCP for her anxiety. She is waiting for Endocrinology appointment.

## 2023-08-13 NOTE — Telephone Encounter (Signed)
Melatonin preferred for sleep. Instead of CTA, if able to walk treadmill for 7-10 min, recommend exercise nuclear. If unable to do so, recommend PET CT  Thanks MJP

## 2023-08-13 NOTE — Telephone Encounter (Signed)
Pt is returning call.  

## 2023-08-13 NOTE — Telephone Encounter (Signed)
I spoke with the pt and she says that she can walk on a treadmill and prefers to have the Stress Myoview... she verbalized understanding of her instructions and will call if she has any further questions... will send her instructions  to her My Chart as well.

## 2023-08-13 NOTE — Telephone Encounter (Signed)
Done.  Thanks MJP

## 2023-08-13 NOTE — Telephone Encounter (Signed)
Called patient and informed her of Dr. Damian Leavell advisement. Patient would like to get a coronary CTA.  Patient has allergy to contrast. Left message for patient to call back to ask her if she wants to still get test done and take medication protocol of prednisone and benadryl. If patient does not want to do this test due to allergies, will see if Dr. Rosemary Holms wants to order a myoview or Cardiac PET CT.  As we await patient to return call. Will ask Dr. Rosemary Holms about patient's other question she had for him. She would like to know if it is okay to take melatonin or xyzal for sleep, and are either two of these okay with the rest of her cardiac medications.

## 2023-08-19 ENCOUNTER — Encounter: Payer: Self-pay | Admitting: Cardiology

## 2023-08-21 ENCOUNTER — Telehealth (HOSPITAL_COMMUNITY): Payer: Self-pay

## 2023-08-21 NOTE — Telephone Encounter (Signed)
Detailed instructions left on the patient's answering machine. Asked to call back with any questions. S.Aby Gessel CCT

## 2023-08-26 ENCOUNTER — Ambulatory Visit (HOSPITAL_COMMUNITY): Payer: Commercial Managed Care - PPO | Attending: Cardiology

## 2023-08-26 DIAGNOSIS — R9431 Abnormal electrocardiogram [ECG] [EKG]: Secondary | ICD-10-CM | POA: Insufficient documentation

## 2023-08-26 DIAGNOSIS — R072 Precordial pain: Secondary | ICD-10-CM | POA: Diagnosis not present

## 2023-08-26 MED ORDER — TECHNETIUM TC 99M TETROFOSMIN IV KIT
25.9000 | PACK | Freq: Once | INTRAVENOUS | Status: AC | PRN
  Administered 2023-08-26: 25.9 via INTRAVENOUS

## 2023-08-26 NOTE — Telephone Encounter (Signed)
PT came in today asking about her meds would like a call as soon as you can.  She is stating a new med and would like to know if it still ok to take the the one Dr. Rosemary Holms put her on.

## 2023-08-27 ENCOUNTER — Ambulatory Visit (HOSPITAL_COMMUNITY): Payer: Commercial Managed Care - PPO

## 2023-08-27 LAB — MYOCARDIAL PERFUSION IMAGING
Angina Index: 0
Base ST Depression (mm): 0 mm
Duke Treadmill Score: 4
Estimated workload: 5
Exercise duration (min): 4 min
LV dias vol: 80 mL (ref 46–106)
LV sys vol: 26 mL
MPHR: 186 {beats}/min
Nuc Stress EF: 67 %
Peak HR: 176 {beats}/min
Percent HR: 95 %
RPE: 19
Rest HR: 123 {beats}/min
Rest Nuclear Isotope Dose: 26.2 mCi
SDS: 2
SRS: 0
SSS: 2
ST Depression (mm): 0 mm
Stress Nuclear Isotope Dose: 25.9 mCi
TID: 0.89

## 2023-08-27 MED ORDER — TECHNETIUM TC 99M TETROFOSMIN IV KIT
26.2000 | PACK | Freq: Once | INTRAVENOUS | Status: AC | PRN
  Administered 2023-08-27: 26.2 via INTRAVENOUS

## 2023-09-01 ENCOUNTER — Telehealth: Payer: Self-pay | Admitting: *Deleted

## 2023-09-01 NOTE — Telephone Encounter (Signed)
No significant heart muscle circulation abnormalities noted on stress test. Mild artifact likely due to shadowing form other structures. I do not think think you have any significant heart artery narrowing.   Regards, Dr. Rosemary Holms  I spoke with patient and reviewed stress test results with her.  Patient is asking if OK to take testosterone 2 % lotion with cardizem

## 2023-09-02 MED ORDER — DILTIAZEM HCL ER COATED BEADS 120 MG PO CP24: 120.0000 mg | ORAL_CAPSULE | Freq: Every day | ORAL | Status: DC | PRN

## 2023-09-02 NOTE — Telephone Encounter (Signed)
Reviewed with Laural Golden, RPh who recommends patient switch the diltiazem to PRN.  Patient notified of recommendations regarding testosterone and diltiazem

## 2023-09-02 NOTE — Telephone Encounter (Signed)
Dr Joaquim Nam ok with starting testosterone per his 10/22 note

## 2023-09-11 ENCOUNTER — Ambulatory Visit: Payer: Commercial Managed Care - PPO | Attending: Physician Assistant | Admitting: Physician Assistant

## 2023-09-11 NOTE — Progress Notes (Deleted)
  Cardiology Office Note:  .   Date:  09/11/2023  ID:  Julia Anderson, DOB Nov 26, 1985, MRN [id number] PCP: Julia Salt, PA  [facility] Providers Cardiologist:  None {  History of Present Illness: .   Julia Anderson is a 37 y.o. female with cerebral palsy, PSVT, elevated blood pressure without diagnosis of hypertension here for follow-up appointment.  Was seen recently 07/30/2023 and had episode of elevated blood pressure as high as 180/110 mmHg.  Sometimes notices episodes of rapid heartbeat with heart rate up to 106 bpm.  Under a lot of stress lately.  Does correlate her elevated blood pressure to discontinuation of her oral contraceptive pills.  She has a few other symptoms of hot flashes etc.  Going to see OB/GYN soon.  At her last visit her metoprolol succinate was continued at 25 mg daily but diltiazem was added at 128 mg daily for hypertension and arrhythmias  Today, she***  ROS: Pertinent ROS in HPI  Studies Reviewed: .        *** Risk Assessment/Calculations:   {Does this patient have ATRIAL FIBRILLATION?:[phone]} No BP recorded.  {Refresh Note OR Click here to enter BP  :1}***       Physical Exam:   VS:  There were no vitals taken for this visit.   Wt Readings from Last 3 Encounters:  08/26/23 244 lb (110.7 kg)  08/05/23 244 lb 12.8 oz (111 kg)  07/30/23 244 lb 12.8 oz (111 kg)    GEN: Well nourished, well developed in no acute distress NECK: No JVD; No carotid bruits CARDIAC: ***RRR, no murmurs, rubs, gallops RESPIRATORY:  Clear to auscultation without rales, wheezing or rhonchi  ABDOMEN: Soft, non-tender, non-distended EXTREMITIES:  No edema; No deformity   ASSESSMENT AND PLAN: .   PSVT Hypertension    {Are you ordering a CV Procedure (e.g. stress test, cath, DCCV, TEE, etc)?   Press F2        :[id number]}  Dispo: ***  Signed, Sharlene Dory, PA-C

## 2023-12-08 ENCOUNTER — Emergency Department (HOSPITAL_BASED_OUTPATIENT_CLINIC_OR_DEPARTMENT_OTHER)
Admission: EM | Admit: 2023-12-08 | Discharge: 2023-12-08 | Disposition: A | Discharge status: Home / Self Care | Payer: Commercial Managed Care - PPO | Attending: Emergency Medicine | Admitting: Emergency Medicine

## 2023-12-08 ENCOUNTER — Other Ambulatory Visit: Payer: Self-pay

## 2023-12-08 ENCOUNTER — Emergency Department (HOSPITAL_BASED_OUTPATIENT_CLINIC_OR_DEPARTMENT_OTHER): Payer: Commercial Managed Care - PPO

## 2023-12-08 ENCOUNTER — Encounter (HOSPITAL_BASED_OUTPATIENT_CLINIC_OR_DEPARTMENT_OTHER): Payer: Self-pay | Admitting: Emergency Medicine

## 2023-12-08 DIAGNOSIS — J0101 Acute recurrent maxillary sinusitis: Secondary | ICD-10-CM | POA: Diagnosis not present

## 2023-12-08 DIAGNOSIS — D72819 Decreased white blood cell count, unspecified: Secondary | ICD-10-CM | POA: Insufficient documentation

## 2023-12-08 DIAGNOSIS — R519 Headache, unspecified: Secondary | ICD-10-CM | POA: Diagnosis present

## 2023-12-08 LAB — CBC WITH DIFFERENTIAL/PLATELET
Abs Immature Granulocytes: 0.01 10*3/uL (ref 0.00–0.07)
Basophils Absolute: 0 10*3/uL (ref 0.0–0.1)
Basophils Relative: 1 %
Eosinophils Absolute: 0.1 10*3/uL (ref 0.0–0.5)
Eosinophils Relative: 2 %
HCT: 40.7 % (ref 36.0–46.0)
Hemoglobin: 14.1 g/dL (ref 12.0–15.0)
Immature Granulocytes: 0 %
Lymphocytes Relative: 46 %
Lymphs Abs: 1.4 10*3/uL (ref 0.7–4.0)
MCH: 32.2 pg (ref 26.0–34.0)
MCHC: 34.6 g/dL (ref 30.0–36.0)
MCV: 92.9 fL (ref 80.0–100.0)
Monocytes Absolute: 0.3 10*3/uL (ref 0.1–1.0)
Monocytes Relative: 9 %
Neutro Abs: 1.3 10*3/uL — ABNORMAL LOW (ref 1.7–7.7)
Neutrophils Relative %: 42 %
Platelets: 230 10*3/uL (ref 150–400)
RBC: 4.38 MIL/uL (ref 3.87–5.11)
RDW: 12 % (ref 11.5–15.5)
WBC: 3 10*3/uL — ABNORMAL LOW (ref 4.0–10.5)
nRBC: 0 % (ref 0.0–0.2)

## 2023-12-08 LAB — BASIC METABOLIC PANEL
Anion gap: 10 (ref 5–15)
BUN: 9 mg/dL (ref 6–20)
CO2: 24 mmol/L (ref 22–32)
Calcium: 8.4 mg/dL — ABNORMAL LOW (ref 8.9–10.3)
Chloride: 103 mmol/L (ref 98–111)
Creatinine, Ser: 0.79 mg/dL (ref 0.44–1.00)
GFR, Estimated: >60 mL/min (ref >60)
Glucose, Bld: 98 mg/dL (ref 70–99)
Potassium: 3.5 mmol/L (ref 3.5–5.1)
Sodium: 137 mmol/L (ref 135–145)

## 2023-12-08 LAB — MAGNESIUM: Magnesium: 2.1 mg/dL (ref 1.7–2.4)

## 2023-12-08 LAB — HCG, SERUM, QUALITATIVE: Preg, Serum: NEGATIVE

## 2023-12-08 MED ORDER — CETIRIZINE HCL 10 MG PO TABS
10.0000 mg | ORAL_TABLET | Freq: Every day | ORAL | 0 refills | Status: DC
Start: 2023-12-08 — End: 2024-08-12

## 2023-12-08 MED ORDER — AMOXICILLIN-POT CLAVULANATE 875-125 MG PO TABS: 1.0000 | ORAL_TABLET | Freq: Two times a day (BID) | ORAL | 0 refills | Status: DC

## 2023-12-08 MED ORDER — PREDNISONE 10 MG (21) PO TBPK
ORAL_TABLET | Freq: Every day | ORAL | 0 refills | Status: DC
Start: 2023-12-08 — End: 2024-08-12

## 2023-12-08 MED ORDER — DEXAMETHASONE SODIUM PHOSPHATE 10 MG/ML IJ SOLN
10.0000 mg | Freq: Once | INTRAMUSCULAR | Status: AC
  Administered 2023-12-08: 10 mg via INTRAVENOUS
  Filled 2023-12-08: qty 1

## 2023-12-08 MED ORDER — FLUTICASONE PROPIONATE 50 MCG/ACT NA SUSP
1.0000 | Freq: Every day | NASAL | 0 refills | Status: AC
Start: 2023-12-08 — End: ?

## 2023-12-08 MED ORDER — PROCHLORPERAZINE EDISYLATE 10 MG/2ML IJ SOLN
10.0000 mg | Freq: Once | INTRAMUSCULAR | Status: AC
  Administered 2023-12-08: 10 mg via INTRAVENOUS
  Filled 2023-12-08: qty 2

## 2023-12-08 MED ORDER — DIPHENHYDRAMINE HCL 50 MG/ML IJ SOLN
12.5000 mg | Freq: Once | INTRAMUSCULAR | Status: AC
  Administered 2023-12-08: 12.5 mg via INTRAVENOUS
  Filled 2023-12-08: qty 1

## 2023-12-08 MED ORDER — SODIUM CHLORIDE 0.9 % IV BOLUS
1000.0000 mL | Freq: Once | INTRAVENOUS | Status: AC
  Administered 2023-12-08: 1000 mL via INTRAVENOUS

## 2023-12-08 NOTE — Discharge Instructions (Signed)
 Your CT scan showed persistent sinusitis which is infection in the nasal cavity.  We have written you for a few medications to help with your symptoms.  Make sure to follow-up outpatient, return for any worsening symptoms

## 2023-12-08 NOTE — ED Triage Notes (Signed)
 Reports was sick 2 weeks ago with headache and sinus infection completed her Z -pack today  , tingling of face and head started 10 days ago .  Reports Hx anxiety , no meds .  Alert and oriented x 4 , no focal neuro deficit . Ambulatory to triage with steady gait .

## 2023-12-08 NOTE — ED Provider Notes (Signed)
 [place] [facility] Provider Note   CSN: [id number] Arrival date & time: 12/08/23  1642    History  Chief Complaint  Patient presents with   Tingling    Face     Julia Anderson is a 38 y.o. female.  HA x 1 week. Sinus pressure, recently finished zpack for sinusitis. HA and pressure to left head now, URI sx improved. Pink prick sensation to scalp on left x 10 days. No weakness, slurred speech, facial droop. Some pain to face, went to dentist told wisdom teeth on left need to come out however not infection. No facial swelling, proptosis. Hx of migraines, does not feel similar.  HPI     Home Medications Prior to Admission medications   Medication Sig Start Date End Date Taking? Authorizing Provider  amoxicillin -clavulanate (AUGMENTIN ) 875-125 MG tablet Take 1 tablet by mouth every 12 (twelve) hours. 12/08/23  Yes Ricketts Shellhammer A, PA-C  cetirizine  (ZYRTEC  ALLERGY) 10 MG tablet Take 1 tablet (10 mg total) by mouth daily. 12/08/23  Yes Lexys Milliner A, PA-C  fluticasone  (FLONASE ) 50 MCG/ACT nasal spray Place 1 spray into both nostrils daily. 12/08/23  Yes Daylen Lipsky A, PA-C  predniSONE  (STERAPRED UNI-PAK 21 TAB) 10 MG (21) TBPK tablet Take by mouth daily. Take 6 tabs by mouth daily  for 1 days, then 5 tabs for 1 days, then 4 tabs for 1 days, then 3 tabs for 1 days, 2 tabs for 1 days, then 1 tab by mouth daily for 1 days 12/08/23  Yes Jarren Para A, PA-C  ascorbic acid (VITAMIN C) 500 MG tablet Take 500 mg by mouth daily. 05/02/20   [provider]  Blood Pressure Monitoring (OMRON 3 SERIES BP MONITOR) DEVI Use as directed 02/05/22     diltiazem  (CARDIZEM  CD) 120 MG 24 hr capsule Take 1 capsule (120 mg total) by mouth daily as needed. 09/02/23   Patwardhan, Kaye Parsons, MD  diltiazem  (CARDIZEM ) 30 MG tablet Take 1 tablet (30 mg total) by mouth 3 (three) times daily as needed. Patient not taking: Reported on 07/30/2023 03/15/22    Cody Das, MD  drospirenone -ethinyl estradiol  (LORYNA ) 3-0.02 MG tablet Take 1 tablet by mouth daily. 11/21/21     metoprolol  succinate (TOPROL -XL) 25 MG 24 hr tablet Take 1 tablet (25 mg total) by mouth daily. Take with or immediately following a meal. 07/30/23   Patwardhan, Manish J, MD  Multiple Vitamin (MULTI-VITAMIN) tablet Take 1 tablet by mouth daily. 01/27/16   [provider]      Allergies    Phentermine, Citalopram , Escitalopram , Fluvoxamine, Iodinated contrast media, Iohexol , Other, Sertraline, and Wellbutrin  [bupropion ]    Review of Systems   Review of Systems  Constitutional: Negative.   HENT:  Positive for congestion, dental problem, postnasal drip, rhinorrhea, sinus pressure, sinus pain and sneezing. Negative for ear discharge, ear pain, nosebleeds, sore throat, trouble swallowing and voice change.   Respiratory: Negative.    Cardiovascular: Negative.   Gastrointestinal: Negative.   Genitourinary: Negative.   Musculoskeletal: Negative.   Skin: Negative.   Neurological:  Positive for headaches. Negative for dizziness, tremors, seizures, syncope, facial asymmetry, speech difficulty, weakness, light-headedness and numbness.  All other systems reviewed and are negative.  Physical Exam Updated Vital Signs BP (!) 120/105 (BP Location: Right Arm)   Pulse 79   Temp 98.1 F (36.7 C) (Oral)   Resp 20   Wt 103 kg   SpO2 100%   BMI 33.52  kg/m  Physical Exam Physical Exam  Constitutional: Pt is oriented to person, place, and time. Pt appears well-developed and well-nourished. No distress.  HENT:  Head: Normocephalic and atraumatic.  Mouth/Throat: Oropharynx is clear and moist.  Eyes: Conjunctivae and EOM are normal. Pupils are equal, round, and reactive to light. No scleral icterus.  No horizontal, vertical or rotational nystagmus  Neck: Normal range of motion. Neck supple.  Full active and passive ROM without pain No midline or paraspinal tenderness No  nuchal rigidity or meningeal signs  Nose: Rhinorrhea left nares, tenderness left maxillary sinus and left frontal sinus Cardiovascular: Normal rate, regular rhythm and intact distal pulses.   Pulmonary/Chest: Effort normal and breath sounds normal. No respiratory distress. Pt has no wheezes. No rales.  Abdominal: Soft. Bowel sounds are normal. There is no tenderness. There is no rebound and no guarding.  Musculoskeletal: Normal range of motion.  Lymphadenopathy:    No cervical adenopathy.  Neurological: Pt. is alert and oriented to person, place, and time. He has normal reflexes. No cranial nerve deficit.  Exhibits normal muscle tone. Coordination normal.  Mental Status:  Alert, oriented, thought content appropriate. Speech fluent without evidence of aphasia. Able to follow 2 step commands without difficulty.  Cranial Nerves:  2-12 grossly intact Motor:  Equal strength BIL Sensory: intact sensation Gait: normal gait and balance CV: distal pulses palpable throughout   Skin: Skin is warm and dry. No rash noted. Pt is not diaphoretic.  Psychiatric: Pt has a normal mood and affect. Behavior is normal. Judgment and thought content normal.  Nursing note and vitals reviewed.  ED Results / Procedures / Treatments   Labs (all labs ordered are listed, but only abnormal results are displayed) Labs Reviewed  CBC WITH DIFFERENTIAL/PLATELET - Abnormal; Notable for the following components:      Result Value   WBC 3.0 (*)    Neutro Abs 1.3 (*)    All other components within normal limits  BASIC METABOLIC PANEL - Abnormal; Notable for the following components:   Calcium 8.4 (*)    All other components within normal limits  MAGNESIUM  HCG, SERUM, QUALITATIVE    EKG None  Radiology CT Maxillofacial Wo Contrast Result Date: 12/08/2023 CLINICAL DATA:  Headache and left facial pain. EXAM: CT MAXILLOFACIAL WITHOUT CONTRAST TECHNIQUE: Multidetector CT imaging of the maxillofacial structures was  performed. Multiplanar CT image reconstructions were also generated. RADIATION DOSE REDUCTION: This exam was performed according to the departmental dose-optimization program which includes automated exposure control, adjustment of the mA and/or kV according to patient size and/or use of iterative reconstruction technique. COMPARISON:  None Available. FINDINGS: Osseous: No osseous abnormality. Orbits: Normal Sinuses: Frontal sinuses are clear. Mild mucosal thickening of the ethmoid sinuses without opacification. Moderate mucosal thickening at both maxillary sinus floors. Small air-fluid level in the left maxillary sinus consistent with rhinosinusitis. Mild mucosal thickening along the anterior walls and floor of the sphenoid sinus. Soft tissues: Other regional soft tissues are normal. Limited intracranial: Normal IMPRESSION: Moderate mucosal thickening at both maxillary sinus floors. Small air-fluid level in the left maxillary sinus consistent with rhinosinusitis. Mild mucosal thickening along the anterior walls and floor of the sphenoid sinus. Electronically Signed   By: Bettylou Brunner M.D.   On: 12/08/2023 20:27   CT Head Wo Contrast Result Date: 12/08/2023 CLINICAL DATA:  Headache.  Left facial pain. EXAM: CT HEAD WITHOUT CONTRAST TECHNIQUE: Contiguous axial images were obtained from the base of the skull through the vertex without  intravenous contrast. RADIATION DOSE REDUCTION: This exam was performed according to the departmental dose-optimization program which includes automated exposure control, adjustment of the mA and/or kV according to patient size and/or use of iterative reconstruction technique. COMPARISON:  None Available. FINDINGS: Brain: The brain shows a normal appearance without evidence of malformation, atrophy, old or acute small or large vessel infarction, mass lesion, hemorrhage, hydrocephalus or extra-axial collection. Vascular: No hyperdense vessel. No evidence of atherosclerotic  calcification. Skull: Normal.  No traumatic finding.  No focal bone lesion. Sinuses/Orbits: Sinuses are clear. Orbits appear normal. Mastoids are clear. Other: None significant IMPRESSION: Normal head CT. Electronically Signed   By: Bettylou Brunner M.D.   On: 12/08/2023 20:24    Procedures Procedures    Medications Ordered in ED Medications  prochlorperazine  (COMPAZINE ) injection 10 mg (10 mg Intravenous Given 12/08/23 2006)  diphenhydrAMINE  (BENADRYL ) injection 12.5 mg (12.5 mg Intravenous Given 12/08/23 2006)  dexamethasone  (DECADRON ) injection 10 mg (10 mg Intravenous Given 12/08/23 2007)  sodium chloride  0.9 % bolus 1,000 mL (0 mLs Intravenous Stopped 12/08/23 2113)   ED Course/ Medical Decision Making/ A&P   38 year old here for evaluation of headache, sinus pressure and congestion.  Recently completed azithromycin  for sinusitis. Also noted directed pinprick sensation to her left head.  She has a nonfocal neuroexam without deficits.  Does have some purulent rhinorrhea tenderness over left frontal maxillary sinuses.  Full range of motion to eyes without pain.  No obvious facial swelling.  Plan on migraine cocktail, labs, imaging.  Labs and imaging personally viewed and interpreted:  CBC leukopenia at 3.0 BMP calcium 8.4 Mag 2.1 Preg neg CT head without acute abnormality CT max face with sinusitis on left  Low suspicion for SAH, IIH, dural venous sinus thrombosis, meningitis, periorbital/orbital cellulitis, acute angle glaucoma, trigeminal neuralgia, CVA, dissection, demyelinating process  Patient reassessed.  Symptoms improved with meds in ED.  Requesting DC home.  Will change her antibiotics add on course of steroids, have her FU with ENT. Will have her return for new or worsening symptoms.  The patient has been appropriately medically screened and/or stabilized in the ED. I have low suspicion for any other emergent medical condition which would require further screening, evaluation or  treatment in the ED or require inpatient management.  Patient is hemodynamically stable and in no acute distress.  Patient able to ambulate in department prior to ED.  Evaluation does not show acute pathology that would require ongoing or additional emergent interventions while in the emergency department or further inpatient treatment.  I have discussed the diagnosis with the patient and answered all questions.  Pain is been managed while in the emergency department and patient has no further complaints prior to discharge.  Patient is comfortable with plan discussed in room and is stable for discharge at this time.  I have discussed strict return precautions for returning to the emergency department.  Patient was encouraged to follow-up with PCP/specialist refer to at discharge.                                Medical Decision Making Amount and/or Complexity of Data Reviewed Labs: ordered. Radiology: ordered.  Risk OTC drugs. Prescription drug management.          Final Clinical Impression(s) / ED Diagnoses Final diagnoses:  Acute recurrent maxillary sinusitis    Rx / DC Orders ED Discharge Orders  Ordered    amoxicillin -clavulanate (AUGMENTIN ) 875-125 MG tablet  Every 12 hours        12/08/23 2151    predniSONE  (STERAPRED UNI-PAK 21 TAB) 10 MG (21) TBPK tablet  Daily        12/08/23 2151    fluticasone  (FLONASE ) 50 MCG/ACT nasal spray  Daily        12/08/23 2151    cetirizine  (ZYRTEC  ALLERGY) 10 MG tablet  Daily        12/08/23 2151              Clarann Helvey A, PA-C 12/08/23 2158    Coleman Daughters, MD 12/09/23 0003

## 2023-12-08 NOTE — ED Notes (Signed)
 Patient transported to CT

## 2024-06-25 ENCOUNTER — Ambulatory Visit: Admitting: Cardiology

## 2024-08-12 ENCOUNTER — Ambulatory Visit: Attending: Cardiology | Admitting: Cardiology

## 2024-08-12 ENCOUNTER — Encounter: Payer: Self-pay | Admitting: Cardiology

## 2024-08-12 VITALS — BP 120/84 | HR 81 | Ht 70.0 in | Wt 231.2 lb

## 2024-08-12 DIAGNOSIS — I471 Supraventricular tachycardia, unspecified: Secondary | ICD-10-CM

## 2024-08-12 MED ORDER — DILTIAZEM HCL 30 MG PO TABS: 30.0000 mg | ORAL_TABLET | Freq: Three times a day (TID) | ORAL | 2 refills | Status: AC | PRN

## 2024-08-12 NOTE — Progress Notes (Signed)
 Cardiology Office Note:  .   Date:  08/12/2024  ID:  Julia Anderson, DOB 04-24-1986, MRN [id number] PCP: Rosalea Rosina SAILOR, PA  [facility] Providers Cardiologist:  Newman Lawrence, MD PCP: Rosalea Rosina SAILOR, [place]  Chief Complaint  Patient presents with   PSVT      History of Present Illness: .   Julia Anderson is a 38 y.o. female with cerebral palsy, PSVT.  Patient has had a lot of recent stressors, but has made significant changes to his diet and lifestyle.  She exercises regularly.  She has had only occasional palpitation symptoms.  She is not taking metoprolol  or diltiazem  regularly.  Vitals:   08/12/24 1018  BP: 120/84  Pulse: 81  SpO2: 97%      ROS:  Review of Systems  Cardiovascular:  Positive for palpitations (Only occasional). Negative for chest pain, dyspnea on exertion, leg swelling and syncope.  Psychiatric/Behavioral:  The patient is nervous/anxious.      Studies Reviewed: SABRA    EKG 08/12/2024: Normal sinus rhythm with sinus arrhythmia Rightward axis Low voltage QRS When compared with ECG of 05-Aug-2023 13:23,  rate is slower     Stress test 07/2023:   Baseline ECG rhythm shows sinus tachycardia.   A Bruce protocol stress test was performed. Exercise capacity was moderately impaired. Patient exercised for 4 min. Maximum HR of 176 bpm. MPHR 95.0%. Peak METS 5.0. The patient experienced no angina during the test. The test was stopped because the patient experienced fatigue and dyspnea. The patient reported dyspnea and fatigue during the stress test. Onset of symptoms occurred at stage 2 of the protocol. Symptoms began at minute 4 during stress and ended at minute 2 during recovery. Normal blood pressure and exaggerated heart rate response noted during stress. Heart rate recovery was normal.   No ST deviation was noted. There were no arrhythmias during stress. ECG was interpretable and without significant changes. The ECG was negative for  ischemia.   Breast attenuation artifact was present.   LV perfusion is abnormal. Defect 1: There is a small defect with mild reduction in uptake present in the apical anterior location(s) that is reversible. There is normal wall motion in the defect area. Likely caused by shifting breast attenuation artifact but cannot rule out a very small area of ischemia.   Left ventricular function is normal. Nuclear stress EF: 67%. The left ventricular ejection fraction is hyperdynamic (>65%). End diastolic cavity size is normal. End systolic cavity size is normal. No evidence of transient ischemic dilation (TID) noted.   Prior study not available for comparison.   Findings are equivocal. The study is low risk.  Echocardiogram 2023:  Left ventricle cavity is normal in size and wall thickness. Normal global  wall motion. Normal LV systolic function with EF 55%. Normal diastolic  filling pattern.  No significant valvular abnormality.  No evidence of pulmonary hypertension.   Exercise treadmill stress test 2023: Exercise treadmill stress test performed using Bruce protocol.  Patient reached 6.3 METS, and 91% of age predicted maximum heart rate.  Exercise capacity was low.  No chest pain reported.  Normal heart rate and hemodynamic response. Stress EKG revealed no ischemic changes. Low risk study.    Labs 11/2023: Hb 14.1 Cr 0.79    Physical Exam:   Physical Exam Vitals and nursing note reviewed.  Constitutional:      General: She is not in acute distress. Neck:     Vascular: No JVD.  Cardiovascular:  Rate and Rhythm: Normal rate and regular rhythm.     Heart sounds: Normal heart sounds. No murmur heard. Pulmonary:     Effort: Pulmonary effort is normal.     Breath sounds: Normal breath sounds. No wheezing or rales.  Musculoskeletal:     Right lower leg: No edema.     Left lower leg: No edema.      VISIT DIAGNOSES:   ICD-10-CM   1. PSVT (paroxysmal supraventricular tachycardia)   I47.10 EKG 12-Lead    diltiazem  (CARDIZEM ) 30 MG tablet        ASSESSMENT AND PLAN: .    Julia Anderson is a 38 y.o. female with cerebral palsy, PSVT, elevated blood pressure without diagnosis of hypertension   PSVT: Episodes in May 2023, December 2020, January 2024. 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   However, patient symptoms are currently controlled without any regular medications. Okay to use diltiazem  30 mg for as needed use. In future, if she has significant recurrence of her symptoms, could consider resuming metoprolol  for daily use, or considering ablation.  F/u as needed  Signed, Newman JINNY Lawrence, MD

## 2024-08-12 NOTE — Patient Instructions (Signed)
